# Patient Record
Sex: Male | Born: 1975 | Race: White | Hispanic: No | Marital: Married | Smoking: Former smoker
Health system: Southern US, Community
[De-identification: ages and names within clinical notes are randomized; demographics above are authoritative.]

## PROBLEM LIST (undated history)

## (undated) DIAGNOSIS — K649 Unspecified hemorrhoids: Secondary | ICD-10-CM

## (undated) DIAGNOSIS — Q984 Klinefelter syndrome, unspecified: Secondary | ICD-10-CM

## (undated) DIAGNOSIS — E291 Testicular hypofunction: Secondary | ICD-10-CM

## (undated) DIAGNOSIS — E785 Hyperlipidemia, unspecified: Secondary | ICD-10-CM

## (undated) DIAGNOSIS — K219 Gastro-esophageal reflux disease without esophagitis: Secondary | ICD-10-CM

## (undated) HISTORY — DX: Gastro-esophageal reflux disease without esophagitis: K21.9

## (undated) HISTORY — DX: Testicular hypofunction: E29.1

## (undated) HISTORY — DX: Unspecified hemorrhoids: K64.9

## (undated) HISTORY — PX: OTHER SURGICAL HISTORY: SHX169

## (undated) HISTORY — DX: Klinefelter syndrome, unspecified: Q98.4

## (undated) HISTORY — DX: Hyperlipidemia, unspecified: E78.5

## (undated) HISTORY — PX: NASAL SEPTUM SURGERY: SHX37

---

## 2013-04-29 ENCOUNTER — Emergency Department (HOSPITAL_COMMUNITY)
Admission: EM | Admit: 2013-04-29 | Discharge: 2013-04-29 | Disposition: A | Discharge status: Home / Self Care | Payer: PRIVATE HEALTH INSURANCE | Attending: Emergency Medicine | Admitting: Emergency Medicine

## 2013-04-29 ENCOUNTER — Emergency Department (HOSPITAL_COMMUNITY): Payer: PRIVATE HEALTH INSURANCE

## 2013-04-29 ENCOUNTER — Encounter (HOSPITAL_COMMUNITY): Payer: Self-pay | Admitting: *Deleted

## 2013-04-29 DIAGNOSIS — S20211A Contusion of right front wall of thorax, initial encounter: Secondary | ICD-10-CM

## 2013-04-29 DIAGNOSIS — S20219A Contusion of unspecified front wall of thorax, initial encounter: Secondary | ICD-10-CM | POA: Insufficient documentation

## 2013-04-29 DIAGNOSIS — W1809XA Striking against other object with subsequent fall, initial encounter: Secondary | ICD-10-CM | POA: Insufficient documentation

## 2013-04-29 DIAGNOSIS — Z87891 Personal history of nicotine dependence: Secondary | ICD-10-CM | POA: Insufficient documentation

## 2013-04-29 DIAGNOSIS — Y929 Unspecified place or not applicable: Secondary | ICD-10-CM | POA: Insufficient documentation

## 2013-04-29 DIAGNOSIS — Y9389 Activity, other specified: Secondary | ICD-10-CM | POA: Insufficient documentation

## 2013-04-29 MED ORDER — HYDROCODONE-ACETAMINOPHEN 5-325 MG PO TABS: 1.0000 | ORAL_TABLET | Freq: Four times a day (QID) | ORAL | Status: DC | PRN

## 2013-04-29 MED ORDER — IBUPROFEN 800 MG PO TABS: 800.0000 mg | ORAL_TABLET | Freq: Three times a day (TID) | ORAL | Status: DC

## 2013-04-29 NOTE — Progress Notes (Signed)
Met Patient at bedside.Role of case manager explained.Patient verbalizes his understanding.Patient Health insurance-PCP information not listed On EPIC facesheet.Patient information verified And given to patient check in.Patient reports rib pain as reason for ED visit.No case manager needs identified.

## 2013-04-29 NOTE — ED Provider Notes (Signed)
CSN: [id number]     Arrival date & time 04/29/13  1207 History   First MD Initiated Contact with Patient 04/29/13 1221     Chief Complaint  Patient presents with  . Fall   (Consider location/radiation/quality/duration/timing/severity/associated sxs/prior Treatment) HPI Patient is a 37 year old male who presented to emergency department complaint of a right rib pain. Patient states he had a heavy backpack on which was filled with breakfast and he was hiking for local challenge when he suddenly fell down and he said that backpack at that time was on his chest and it hit him in the right ribs. Patient states that this happened last night. Patient states that he was able to still walk around and he went to work today. Patient states the pain progressively got worse as the day went on. Patient states that today while having a bowel movement he had a brief episode of dizziness. Patient states that he did not have any shortness of breath or abdominal pain. No nausea or vomiting. He took a muscle relaxants for his pain which did not help. History reviewed. No pertinent past medical history. Past Surgical History  Procedure Laterality Date  . Testicular cyst removal     No family history on file. History  Substance Use Topics  . Smoking status: Former Games developer  . Smokeless tobacco: Not on file  . Alcohol Use: Yes     Comment: occ    Review of Systems  Constitutional: Negative for fever and chills.  HENT: Negative for neck pain and neck stiffness.   Respiratory: Negative for cough, chest tightness and shortness of breath.   Cardiovascular: Positive for chest pain. Negative for palpitations and leg swelling.  Gastrointestinal: Negative for nausea, vomiting, abdominal pain, diarrhea and abdominal distention.  Genitourinary: Negative for dysuria, hematuria and flank pain.  Musculoskeletal: Negative for arthralgias.  Skin: Negative for rash.  Allergic/Immunologic: Negative for immunocompromised  state.  Neurological: Negative for dizziness, weakness, light-headedness, numbness and headaches.    Allergies  Review of patient's allergies indicates no known allergies.  Home Medications   Current Outpatient Rx  Name  Route  Sig  Dispense  Refill  . beta carotene w/minerals (OCUVITE) tablet   Oral   Take 1 tablet by mouth daily.         . Calcium Carbonate-Vitamin D (CALCIUM + D PO)   Oral   Take 1 tablet by mouth daily.         . Cholecalciferol (VITAMIN D PO)   Oral   Take 1 tablet by mouth daily.         . Multiple Vitamin (MULTIVITAMIN WITH MINERALS) TABS tablet   Oral   Take 1 tablet by mouth daily.         Marland Kitchen HYDROcodone-acetaminophen (NORCO) 5-325 MG per tablet   Oral   Take 1 tablet by mouth every 6 (six) hours as needed for pain.   15 tablet   0   . ibuprofen (ADVIL,MOTRIN) 800 MG tablet   Oral   Take 1 tablet (800 mg total) by mouth 3 (three) times daily.   21 tablet   0    BP 116/68  Pulse 73  Temp(Src) 97.4 F (36.3 C) (Oral)  Resp 18  SpO2 98% Physical Exam  Nursing note and vitals reviewed. Constitutional: He appears well-developed and well-nourished. No distress.  HENT:  Head: Normocephalic and atraumatic.  Eyes: Conjunctivae are normal.  Neck: Neck supple.  Cardiovascular: Normal rate, regular rhythm and normal heart sounds.  Pulmonary/Chest: Effort normal. No respiratory distress. He has no wheezes. He has no rales.  No bruising or swelling noted over right rib cage. No crepitus. Normal chest wall movement. Tenderness to palpation over ribs 9 and 10. No deformity  Abdominal: Soft. Bowel sounds are normal. There is tenderness.  No bruising  Musculoskeletal: He exhibits no edema.  Neurological: He is alert.  Skin: Skin is warm and dry.    ED Course  Procedures (including critical care time) Labs Review Labs Reviewed - No data to display Imaging Review Dg Ribs Unilateral W/chest Right  04/29/2013   CLINICAL DATA:  Fall.   EXAM: RIGHT RIBS AND CHEST - 3+ VIEW  COMPARISON:  None.  FINDINGS: No fracture or other bone lesions are seen involving the ribs. There is no evidence of pneumothorax or pleural effusion. Both lungs are clear. Heart size and mediastinal contours are within normal limits.  IMPRESSION: 1. No displaced rib fractures identified.   Electronically Signed   By: Signa Kell M.D.   On: 04/29/2013 12:57    MDM   1. Contusion of rib, right, initial encounter    Patient with right rib pain after getting hit with a backpack that was full breakfast. Patient denies any shortness of breath. Patient does not have any abdominal tenderness on exam. X-rays of the ribs and chest obtained and are negative. Will treat patient with pain medications at and ibuprofen at home. Follow up as needed. Return precautions given  Filed Vitals:   04/29/13 1214  BP: 116/68  Pulse: 73  Temp: 97.4 F (36.3 C)  TempSrc: Oral  Resp: 18  SpO2: 98%       Bianco Cange A Tzipora Mcinroy, PA-C 04/29/13 1336

## 2013-04-29 NOTE — ED Notes (Signed)
Pt had 30lb back pack on yesterday and tripped and fell, pt states when he got up right rib cage felt really sore.  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  Pt had BM and felt faint, then got nauseated.  Pt has pain with breathing.

## 2013-04-29 NOTE — ED Notes (Signed)
Tatyana PA at bedside   

## 2013-04-29 NOTE — ED Notes (Signed)
Discharge instructions reviewed with pt. Pt verbalized understanding.   

## 2013-04-29 NOTE — ED Notes (Signed)
Pt states pain comes when he breathes. Pain is located in right upper quadrant. Pt rates pain 6/10. Pt transported to x-ray.

## 2013-04-30 NOTE — ED Provider Notes (Signed)
Medical screening examination/treatment/procedure(s) were performed by non-physician practitioner and as supervising physician I was immediately available for consultation/collaboration.  Derwood Kaplan, MD 04/30/13 1658

## 2013-06-18 ENCOUNTER — Emergency Department (HOSPITAL_COMMUNITY)
Admission: EM | Admit: 2013-06-18 | Discharge: 2013-06-18 | Disposition: A | Discharge status: Home / Self Care | Payer: PRIVATE HEALTH INSURANCE | Attending: Emergency Medicine | Admitting: Emergency Medicine

## 2013-06-18 ENCOUNTER — Emergency Department (HOSPITAL_COMMUNITY): Payer: PRIVATE HEALTH INSURANCE

## 2013-06-18 ENCOUNTER — Encounter (HOSPITAL_COMMUNITY): Payer: Self-pay | Admitting: Emergency Medicine

## 2013-06-18 DIAGNOSIS — R61 Generalized hyperhidrosis: Secondary | ICD-10-CM | POA: Insufficient documentation

## 2013-06-18 DIAGNOSIS — Z87891 Personal history of nicotine dependence: Secondary | ICD-10-CM | POA: Insufficient documentation

## 2013-06-18 DIAGNOSIS — R109 Unspecified abdominal pain: Secondary | ICD-10-CM | POA: Insufficient documentation

## 2013-06-18 LAB — URINALYSIS, ROUTINE W REFLEX MICROSCOPIC
Ketones, ur: NEGATIVE mg/dL
Nitrite: NEGATIVE
Specific Gravity, Urine: 1.029 (ref 1.005–1.030)
Urobilinogen, UA: 0.2 mg/dL (ref 0.0–1.0)

## 2013-06-18 LAB — CBC WITH DIFFERENTIAL/PLATELET
Basophils Absolute: 0.1 10*3/uL (ref 0.0–0.1)
Basophils Relative: 1 % (ref 0–1)
Eosinophils Absolute: 0.2 10*3/uL (ref 0.0–0.7)
Eosinophils Relative: 3 % (ref 0–5)
Lymphs Abs: 3.2 10*3/uL (ref 0.7–4.0)
MCH: 30 pg (ref 26.0–34.0)
MCHC: 33.7 g/dL (ref 30.0–36.0)
MCV: 88.9 fL (ref 78.0–100.0)
Monocytes Relative: 8 % (ref 3–12)
Neutro Abs: 3.1 10*3/uL (ref 1.7–7.7)
Platelets: 296 10*3/uL (ref 150–400)
RBC: 4.77 MIL/uL (ref 4.22–5.81)

## 2013-06-18 LAB — CG4 I-STAT (LACTIC ACID): Lactic Acid, Venous: 1.59 mmol/L (ref 0.5–2.2)

## 2013-06-18 LAB — COMPREHENSIVE METABOLIC PANEL
AST: 84 U/L — ABNORMAL HIGH (ref 0–37)
BUN: 16 mg/dL (ref 6–23)
Calcium: 9.6 mg/dL (ref 8.4–10.5)
GFR calc Af Amer: >90 mL/min (ref >90)
Glucose, Bld: 123 mg/dL — ABNORMAL HIGH (ref 70–99)
Potassium: 4.2 mEq/L (ref 3.5–5.1)
Sodium: 142 mEq/L (ref 135–145)
Total Protein: 7.6 g/dL (ref 6.0–8.3)

## 2013-06-18 LAB — TROPONIN I: Troponin I: <0.3 ng/mL (ref <0.30)

## 2013-06-18 LAB — LIPASE, BLOOD: Lipase: 56 U/L (ref 11–59)

## 2013-06-18 MED ORDER — HYDROMORPHONE HCL PF 1 MG/ML IJ SOLN
1.0000 mg | Freq: Once | INTRAMUSCULAR | Status: AC
  Administered 2013-06-18: 1 mg via INTRAVENOUS
  Filled 2013-06-18: qty 1

## 2013-06-18 MED ORDER — MAGNESIUM CITRATE PO SOLN: 1.0000 | Freq: Once | ORAL | Status: DC

## 2013-06-18 MED ORDER — IOHEXOL 300 MG/ML  SOLN
25.0000 mL | INTRAMUSCULAR | Status: DC | PRN
  Administered 2013-06-18: 25 mL via ORAL

## 2013-06-18 MED ORDER — HYDROCODONE-ACETAMINOPHEN 5-325 MG PO TABS: 2.0000 | ORAL_TABLET | ORAL | Status: DC | PRN

## 2013-06-18 MED ORDER — IOHEXOL 300 MG/ML  SOLN
100.0000 mL | Freq: Once | INTRAMUSCULAR | Status: AC | PRN
  Administered 2013-06-18: 100 mL via INTRAVENOUS

## 2013-06-18 MED ORDER — DICYCLOMINE HCL 20 MG PO TABS: 20.0000 mg | ORAL_TABLET | Freq: Two times a day (BID) | ORAL | Status: DC

## 2013-06-18 MED ORDER — ONDANSETRON HCL 4 MG/2ML IJ SOLN
4.0000 mg | Freq: Once | INTRAMUSCULAR | Status: AC
  Administered 2013-06-18: 4 mg via INTRAVENOUS
  Filled 2013-06-18: qty 2

## 2013-06-18 NOTE — ED Provider Notes (Signed)
CSN: [id number]     Arrival date & time 06/18/13  4098 History   First MD Initiated Contact with Patient 06/18/13 [phone]     No chief complaint on file.  (Consider location/radiation/quality/duration/timing/severity/associated sxs/prior Treatment) HPI Comments: Patient brought to the ER for evaluation of severe abdominal pain. Symptoms began around 6:30. Patient had sudden onset of severe right upper quadrant and mid abdominal pain. Patient reports pain is constant. He has been experiencing diaphoresis, no vomiting or diarrhea. Pain radiates up into the central chest region.  Patient is a 37 y.o. male presenting with abdominal pain.  Abdominal Pain   No past medical history on file. Past Surgical History  Procedure Laterality Date  . Testicular cyst removal     No family history on file. History  Substance Use Topics  . Smoking status: Former Games developer  . Smokeless tobacco: Not on file  . Alcohol Use: Yes     Comment: occ    Review of Systems  Constitutional: Positive for diaphoresis.  Gastrointestinal: Positive for abdominal pain.  All other systems reviewed and are negative.    Allergies  Review of patient's allergies indicates no known allergies.  Home Medications   Current Outpatient Rx  Name  Route  Sig  Dispense  Refill  . beta carotene w/minerals (OCUVITE) tablet   Oral   Take 1 tablet by mouth daily.         . Calcium Carbonate-Vitamin D (CALCIUM + D PO)   Oral   Take 1 tablet by mouth daily.         . Cholecalciferol (VITAMIN D PO)   Oral   Take 1 tablet by mouth daily.         Marland Kitchen HYDROcodone-acetaminophen (NORCO) 5-325 MG per tablet   Oral   Take 1 tablet by mouth every 6 (six) hours as needed for pain.   15 tablet   0   . ibuprofen (ADVIL,MOTRIN) 800 MG tablet   Oral   Take 1 tablet (800 mg total) by mouth 3 (three) times daily.   21 tablet   0   . Multiple Vitamin (MULTIVITAMIN WITH MINERALS) TABS tablet   Oral   Take 1 tablet by mouth  daily.          BP 126/62  Pulse 82  Resp 18  SpO2 100% Physical Exam  Constitutional: He is oriented to person, place, and time. He appears well-developed and well-nourished. He appears distressed.  HENT:  Head: Normocephalic and atraumatic.  Right Ear: Hearing normal.  Left Ear: Hearing normal.  Nose: Nose normal.  Mouth/Throat: Oropharynx is clear and moist and mucous membranes are normal.  Eyes: Conjunctivae and EOM are normal. Pupils are equal, round, and reactive to light.  Neck: Normal range of motion. Neck supple.  Cardiovascular: Regular rhythm, S1 normal and S2 normal.  Exam reveals no gallop and no friction rub.   No murmur heard. Pulmonary/Chest: Effort normal and breath sounds normal. No respiratory distress. He exhibits no tenderness.  Abdominal: Soft. Normal appearance and bowel sounds are normal. There is no hepatosplenomegaly. There is tenderness in the right upper quadrant and epigastric area. There is no rebound, no guarding, no tenderness at McBurney's point and negative Murphy's sign. No hernia.  Musculoskeletal: Normal range of motion.  Neurological: He is alert and oriented to person, place, and time. He has normal strength. No cranial nerve deficit or sensory deficit. Coordination normal. GCS eye subscore is 4. GCS verbal subscore is 5. GCS motor subscore  is 6.  Skin: Skin is warm, dry and intact. No rash noted. No cyanosis.  Psychiatric: He has a normal mood and affect. His speech is normal and behavior is normal. Thought content normal.    ED Course  Procedures (including critical care time) Labs Review Labs Reviewed - No data to display Imaging Review No results found.  EKG Interpretation   None       MDM  Diagnosis: Abdominal pain  She presents with complaints of severe right upper quadrant abdominal pains, sudden onset. Exam revealed tenderness without guarding or rebound. Patient appear to be in significant distress arrival. He improved with  analgesia. Blood work revealed slightly elevated AST and ALT, otherwise no abnormalities. Ultrasound performed and no evidence of cholelithiasis or cholecystitis seen. CAT scan therefore performed to rule out diverticulitis, appendicitis, SBO. CAT scan showed no acute pathology. Patient will be discharged to follow up with GI for further evaluation. Symptomatic treatment at discharge. He does have some evidence of constipation, possibly because of the pain. Treat with mag citrate in addition to antispasmodic and analgesia.    Gilda Crease, MD 06/21/13 [phone]

## 2013-06-18 NOTE — ED Notes (Signed)
Family at bedside. 

## 2013-06-18 NOTE — ED Notes (Signed)
Pt reports abdominal pain. Pt diaphoretic and tearful.

## 2013-06-18 NOTE — ED Notes (Signed)
PT comfortable with d/c and f/u instructions. Prescriptions x3 

## 2013-06-20 ENCOUNTER — Other Ambulatory Visit: Payer: PRIVATE HEALTH INSURANCE

## 2013-06-20 ENCOUNTER — Telehealth: Payer: Self-pay | Admitting: Gastroenterology

## 2013-06-20 ENCOUNTER — Ambulatory Visit (INDEPENDENT_AMBULATORY_CARE_PROVIDER_SITE_OTHER): Payer: PRIVATE HEALTH INSURANCE | Admitting: Gastroenterology

## 2013-06-20 ENCOUNTER — Encounter: Payer: Self-pay | Admitting: Gastroenterology

## 2013-06-20 VITALS — BP 120/70 | HR 66 | Ht 70.0 in | Wt 182.4 lb

## 2013-06-20 DIAGNOSIS — R1011 Right upper quadrant pain: Secondary | ICD-10-CM | POA: Insufficient documentation

## 2013-06-20 DIAGNOSIS — R109 Unspecified abdominal pain: Secondary | ICD-10-CM

## 2013-06-20 LAB — HEPATIC FUNCTION PANEL
ALT: 59 U/L — ABNORMAL HIGH (ref 0–53)
Albumin: 4.6 g/dL (ref 3.5–5.2)
Alkaline Phosphatase: 64 U/L (ref 39–117)
Total Protein: 7.9 g/dL (ref 6.0–8.3)

## 2013-06-20 NOTE — Telephone Encounter (Signed)
Spoke with pts wife and they called back and wanted to let Dr. Arlyce Dice know that the pts father had to have his gallbladder removed about 5-6 years ago. Dr. Arlyce Dice notified.

## 2013-06-20 NOTE — Telephone Encounter (Signed)
Spoke with pt and he is aware. 

## 2013-06-20 NOTE — Progress Notes (Signed)
History of Present Illness:  37 year old white male referred for evaluation of abdominal pain.  2 nights ago he was seen in the ER for sudden onset severe right upper quadrant pain.  Pain was incapacitating.  It was without radiation or accompanied by nausea or vomiting.  Ultrasound and CT, which I reviewed, were unremarkable.  Lab work was pertinent for normal white count, AST 84 ALT 66.  Urinalysis was normal.  Pain slowly subsided and has not recurred.  About a week ago he had a low-grade fever.  He has no antecedent GI complaints.  He is on testosterone.  There is no history of drug use or transfusions or hepatitis.    History reviewed. No pertinent past medical history. Past Surgical History  Procedure Laterality Date  . Testicular cyst removal     family history includes Prostate cancer in his father. There is no history of Colon cancer. Current Outpatient Prescriptions  Medication Sig Dispense Refill  . beta carotene w/minerals (OCUVITE) tablet Take 1 tablet by mouth daily.      . Calcium Carbonate-Vitamin D (CALCIUM + D PO) Take 1 tablet by mouth daily.      . Cholecalciferol (VITAMIN D PO) Take 1 tablet by mouth daily.      Marland Kitchen ibuprofen (ADVIL,MOTRIN) 200 MG tablet Take 600 mg by mouth every 6 (six) hours as needed for fever.      . Multiple Vitamin (MULTIVITAMIN WITH MINERALS) TABS tablet Take 1 tablet by mouth daily.      . Testosterone (ANDROGEL PUMP) 20.25 MG/ACT (1.62%) GEL Place 3 Act onto the skin daily.       No current facility-administered medications for this visit.   Allergies as of 06/20/2013  . (No Known Allergies)    reports that he has quit smoking. His smoking use included Cigarettes. He smoked 0.00 packs per day for 10 years. He has quit using smokeless tobacco. His smokeless tobacco use included Snuff. He reports that he drinks alcohol. He reports that he does not use illicit drugs.     Review of Systems: Approximately month ago he fell on his right side Clydie Braun  a backpack and injured his ribs.  Rib films were negative.  His comfort quickly resolved.  Pertinent positive and negative review of systems were noted in the above HPI section. All other review of systems were otherwise negative.  Vital signs were reviewed in today's medical record Physical Exam: General: Well developed , well nourished, no acute distress Skin: anicteric Head: Normocephalic and atraumatic Eyes:  sclerae anicteric, EOMI Ears: Normal auditory acuity Mouth: No deformity or lesions Neck: Supple, no masses or thyromegaly Lungs: Clear throughout to auscultation Heart: Regular rate and rhythm; no murmurs, rubs or bruits Abdomen: Soft,  and non distended. No masses, hepatosplenomegaly or hernias noted. Normal Bowel sounds.  There is minimal tenderness in the midepigastrium to deep palpation Rectal:deferred Musculoskeletal: Symmetrical with no gross deformities  Skin: No lesions on visible extremities Pulses:  Normal pulses noted Extremities: No clubbing, cyanosis, edema or deformities noted Neurological: Alert oriented x 4, grossly nonfocal Cervical Nodes:  No significant cervical adenopathy Inguinal Nodes: No significant inguinal adenopathy Psychological:  Alert and cooperative. Normal mood and affect

## 2013-06-20 NOTE — Telephone Encounter (Signed)
One liver enzyme has normalized, the other has decreased but is still minimally elevated.  Await HIDA scan

## 2013-06-20 NOTE — Assessment & Plan Note (Signed)
Sudden onset of incapacitating right upper quadrant pain is suggestive of biliary colic.  CT and ultrasound were negative.  Note slightly elevated transaminases.  I remain suspicious that he has biliary colic.  He is taking a testosterone which potentially could cause some mild LFT abnormalities.  Recommendations #1 HIDA scan #2 repeat LFTs; if still abnormal I will obtain hepatitis serologies and markers for chronic liver disease

## 2013-06-20 NOTE — Patient Instructions (Signed)
You have been scheduled for a HIDA scan at [facility] (1st floor) on 11/26 at 7:30am.. Please arrive 15 minutes prior to your scheduled appointment. Make certain not to have anything to eat or drink at least 6 hours prior to your test. Should this appointment date or time not work well for you, please call radiology scheduling at [phone].  _____________________________________________________________________ hepatobiliary (HIDA) scan is an imaging procedure used to diagnose problems in the liver, gallbladder and bile ducts. In the HIDA scan, a radioactive chemical or tracer is injected into a vein in your arm. The tracer is handled by the liver like bile. Bile is a fluid produced and excreted by your liver that helps your digestive system break down fats in the foods you eat. Bile is stored in your gallbladder and the gallbladder releases the bile when you eat a meal. A special nuclear medicine scanner (gamma camera) tracks the flow of the tracer from your liver into your gallbladder and small intestine.  During your HIDA scan  You'll be asked to change into a hospital gown before your HIDA scan begins. Your health care team will position you on a table, usually on your back. The radioactive tracer is then injected into a vein in your arm.The tracer travels through your bloodstream to your liver, where it's taken up by the bile-producing cells. The radioactive tracer travels with the bile from your liver into your gallbladder and through your bile ducts to your small intestine.You may feel some pressure while the radioactive tracer is injected into your vein. As you lie on the table, a special gamma camera is positioned over your abdomen taking pictures of the tracer as it moves through your body. The gamma camera takes pictures continually for about an hour. You'll need to keep still during the HIDA scan. This can become uncomfortable, but you may find that you can lessen the discomfort by taking deep breaths and  thinking about other things. Tell your health care team if you're uncomfortable. The radiologist will watch on a computer the progress of the radioactive tracer through your body. The HIDA scan may be stopped when the radioactive tracer is seen in the gallbladder and enters your small intestine. This typically takes about an hour. In some cases extra imaging will be performed if original images aren't satisfactory, if morphine is given to help visualize the gallbladder or if the medication CCK is given to look at the contraction of the gallbladder. This test typically takes 2 hours to complete. ________________________________________________________________________   Go to the basement for labs today

## 2013-06-20 NOTE — Telephone Encounter (Signed)
Left message for pt to call back  °

## 2013-06-20 NOTE — Telephone Encounter (Signed)
Pt calling for his lab results, please advise.

## 2013-07-05 ENCOUNTER — Encounter (HOSPITAL_COMMUNITY)
Admission: RE | Admit: 2013-07-05 | Discharge: 2013-07-05 | Disposition: A | Discharge status: Home / Self Care | Payer: PRIVATE HEALTH INSURANCE | Source: Ambulatory Visit | Attending: Gastroenterology | Admitting: Gastroenterology

## 2013-07-05 DIAGNOSIS — R109 Unspecified abdominal pain: Secondary | ICD-10-CM | POA: Insufficient documentation

## 2013-07-05 MED ORDER — TECHNETIUM TC 99M MEBROFENIN IV KIT
5.1000 | PACK | Freq: Once | INTRAVENOUS | Status: AC | PRN
  Administered 2013-07-05: 5 via INTRAVENOUS

## 2013-07-10 ENCOUNTER — Other Ambulatory Visit: Payer: Self-pay

## 2013-07-10 ENCOUNTER — Telehealth: Payer: Self-pay

## 2013-07-10 DIAGNOSIS — R109 Unspecified abdominal pain: Secondary | ICD-10-CM

## 2013-07-10 NOTE — Telephone Encounter (Signed)
Notes Recorded by Louis Meckel, MD on 07/10/2013 at 10:52 AM HIDA scan is normal. Patient needs blood work including iron, TIBC, ferritin, ANA, AMA, alpha-1 antitrypsin and ceruloplasmin levels, hepatitis A, B, and C serologies   Pt aware and will come for the labs.

## 2013-07-20 ENCOUNTER — Other Ambulatory Visit (INDEPENDENT_AMBULATORY_CARE_PROVIDER_SITE_OTHER): Payer: PRIVATE HEALTH INSURANCE

## 2013-07-20 DIAGNOSIS — R109 Unspecified abdominal pain: Secondary | ICD-10-CM

## 2013-07-20 LAB — IRON AND TIBC
Iron: 65 ug/dL (ref 42–165)
UIBC: 322 ug/dL (ref 125–400)

## 2013-07-20 LAB — IRON: Iron: 62 ug/dL (ref 42–165)

## 2013-07-20 LAB — FERRITIN: Ferritin: 124.7 ng/mL (ref 22.0–322.0)

## 2013-07-21 ENCOUNTER — Ambulatory Visit (INDEPENDENT_AMBULATORY_CARE_PROVIDER_SITE_OTHER): Payer: PRIVATE HEALTH INSURANCE | Admitting: Gastroenterology

## 2013-07-21 ENCOUNTER — Encounter: Payer: Self-pay | Admitting: Gastroenterology

## 2013-07-21 VITALS — BP 100/58 | HR 80 | Ht 70.0 in | Wt 187.5 lb

## 2013-07-21 DIAGNOSIS — R1011 Right upper quadrant pain: Secondary | ICD-10-CM

## 2013-07-21 LAB — HEPATITIS C ANTIBODY: HCV Ab: NEGATIVE

## 2013-07-21 LAB — HEPATITIS B SURFACE ANTIBODY, QUANTITATIVE: Hepatitis B-Post: 0.4 m[IU]/mL

## 2013-07-21 LAB — HEPATITIS A ANTIBODY, TOTAL: Hep A Total Ab: NONREACTIVE

## 2013-07-21 LAB — ANA: Anti Nuclear Antibody(ANA): NEGATIVE

## 2013-07-21 NOTE — Assessment & Plan Note (Signed)
Etiology of pain is uncertain.  Workup for biliary tract disease including CT, ultrasound and HIDA scan was negative.  There was slight improvement in his LFTs still raising the question of biliary colic.  Recommendations #1 expectant management.  The patient was carefully instructed to contact the office or go to the ED should he develop recurrent abdominal pain.  Should this occur and LFTs become elevated, I think this would be good evidence for biliary tract disease.

## 2013-07-21 NOTE — Patient Instructions (Signed)
Follow up as needed

## 2013-07-21 NOTE — Progress Notes (Signed)
          History of Present Illness:  William Horton has returned for followup of abdominal pain.  He's had no recurrences.  Blood work up was entirely unrevealing.  HIDA scan was negative except for ALT 59.  He was previously 66.  AST normalized from 84.    Review of Systems: Pertinent positive and negative review of systems were noted in the above HPI section. All other review of systems were otherwise negative.    Current Medications, Allergies, Past Medical History, Past Surgical History, Family History and Social History were reviewed in [employer] electronic medical record  Vital signs were reviewed in today's medical record. Physical Exam: General: Well developed , well nourished, no acute distress

## 2013-07-22 LAB — ALPHA-1-ANTITRYPSIN: A-1 Antitrypsin, Ser: 135 mg/dL (ref 90–200)

## 2013-07-22 LAB — CERULOPLASMIN: Ceruloplasmin: 23 mg/dL (ref 20–60)

## 2015-07-24 IMAGING — CR DG CHEST 2V
2 series · 2 of 2 positions shown · non-contrast
Comparison: [DATE]

CLINICAL DATA: Chest pain, right abdominal pain and right-sided
back pain. Fever last week.

EXAM:
CHEST  2 VIEW

[w chest pa]
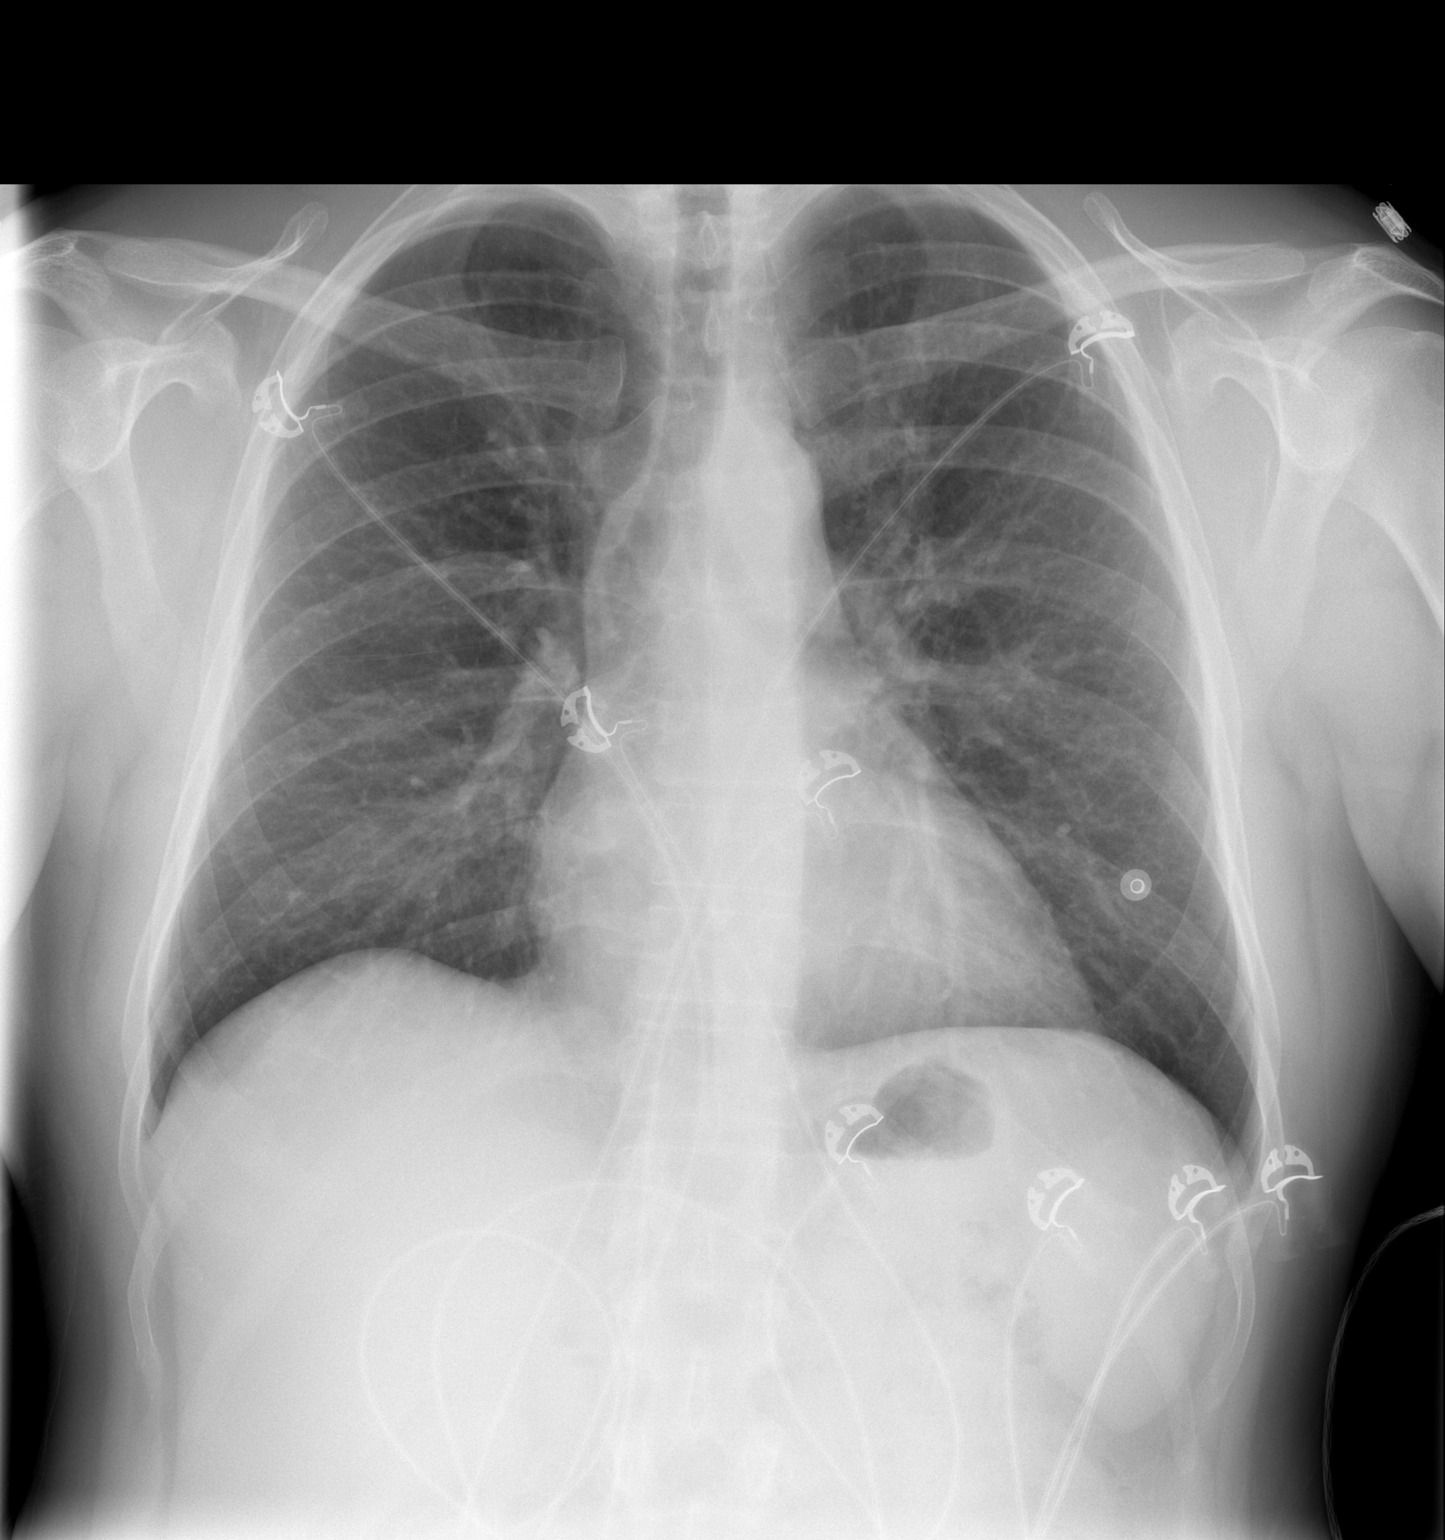

[w chest lat]
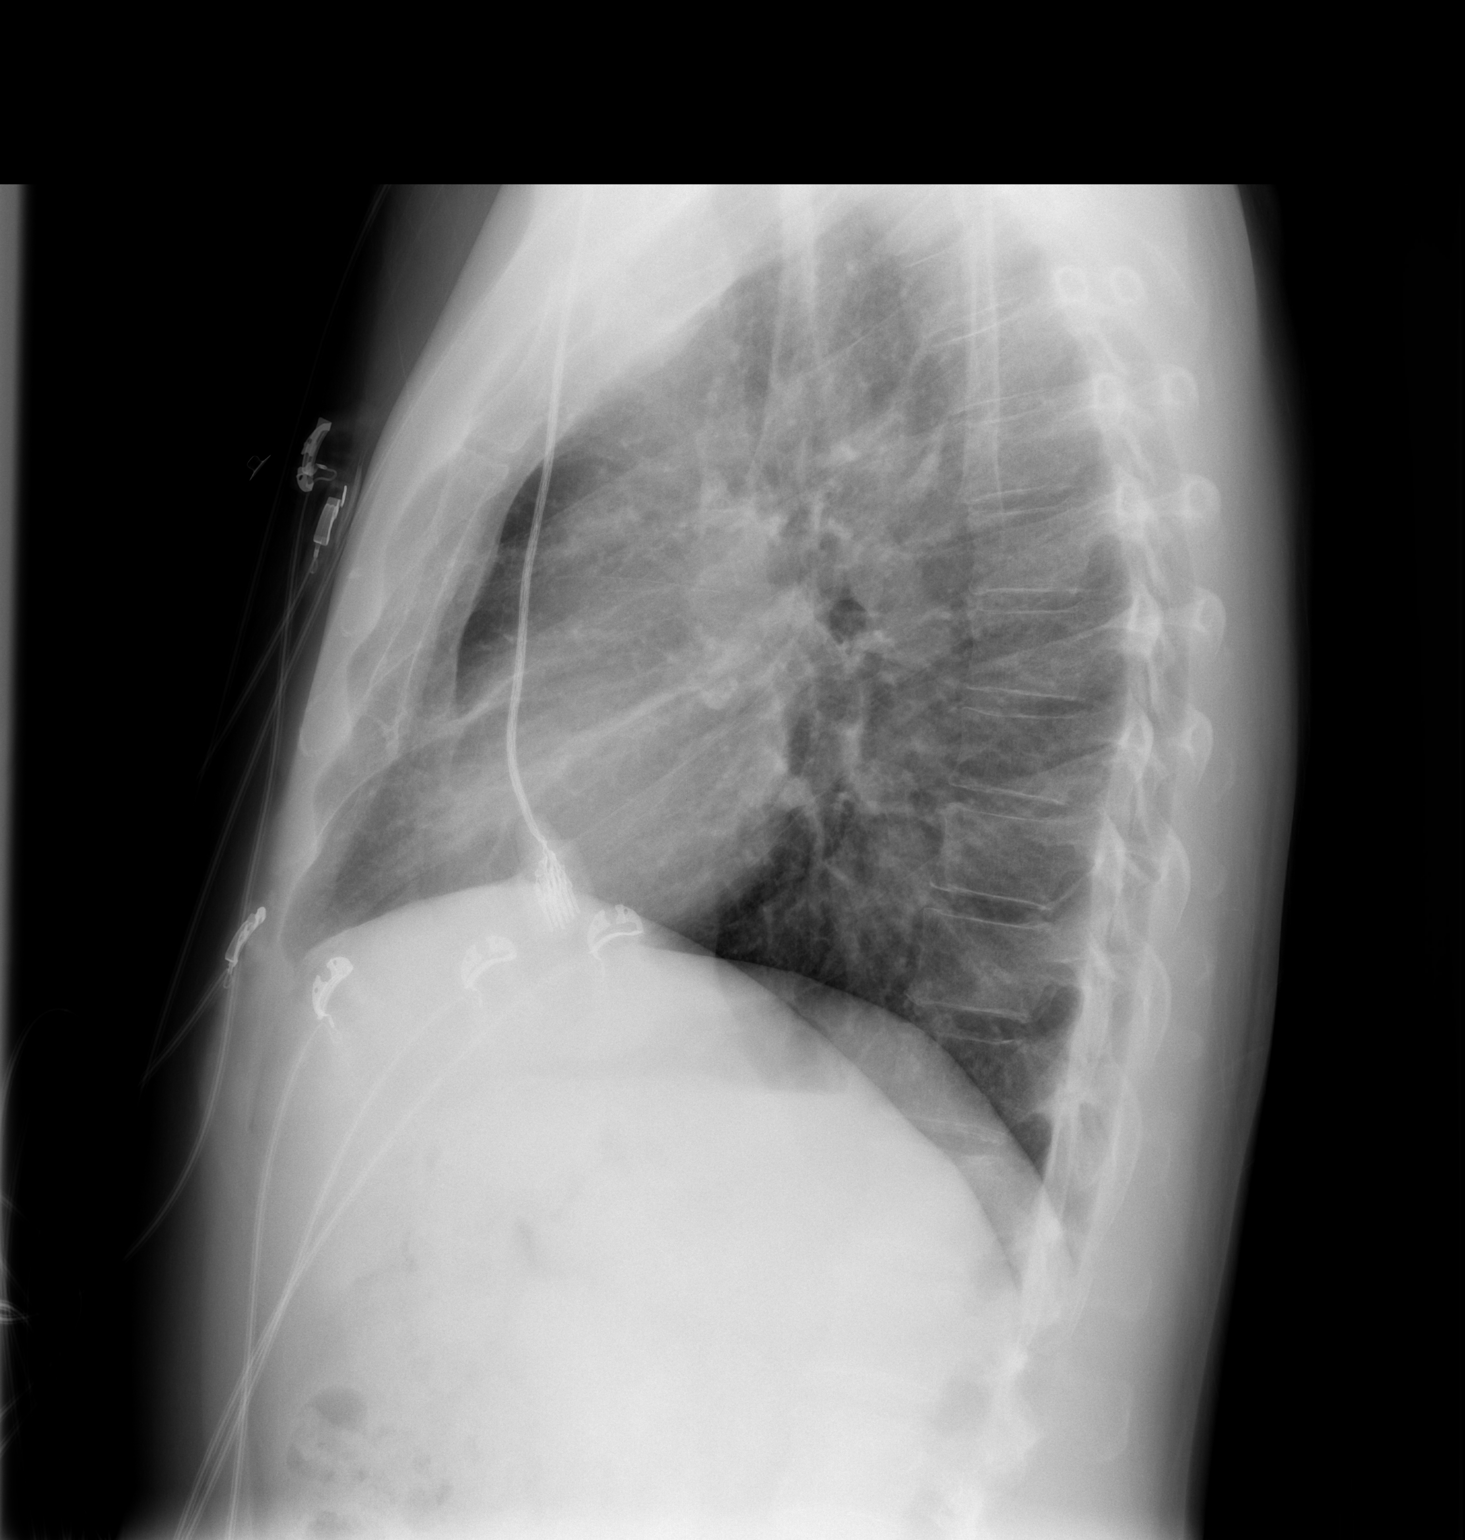

[2 of 2 positions shown; findings below may reference images not displayed]

FINDINGS: Lungs are clear. The cardiomediastinal silhouette and remainder of
the exam is unchanged.
IMPRESSION: No acute cardiopulmonary disease.

## 2015-07-24 IMAGING — CT CT ABD-PELV W/ CM
2 of 4 series · 16 of 46 positions shown, 18 images · IV contrast (APPLIED)
Comparison: None.

CLINICAL DATA: Right abdominal pain.

EXAM:
CT ABDOMEN AND PELVIS WITH CONTRAST
TECHNIQUE: Multidetector CT imaging of the abdomen and pelvis was performed
using the standard protocol following bolus administration of
intravenous contrast.
CONTRAST:  100mL OMNIPAQUE IOHEXOL 300 MG/ML  SOLN

[Series 2: abd/ pelvis 5.0 i30f 1 · axial · 0.61mm/px · z∈[-1119,-709]mm · 13 of 92 slices shown, 15 images]
[im 5/92  soft-tissue]
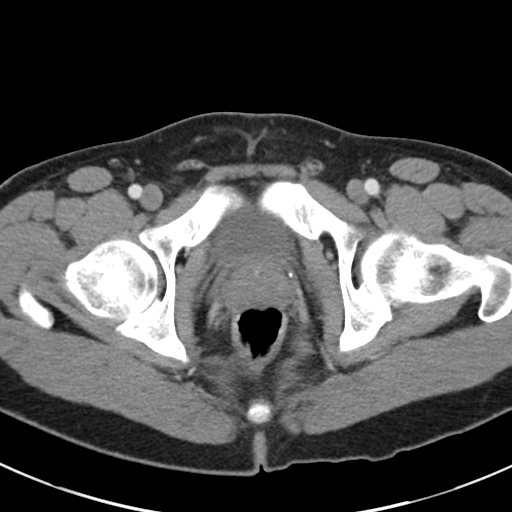
[im 5/92  bone]
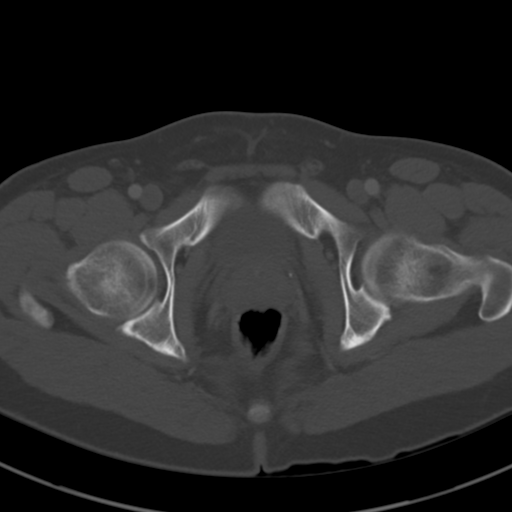
[im 13/92  soft-tissue]
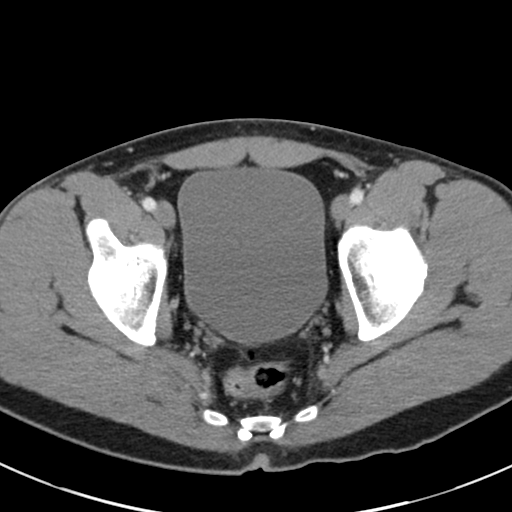
[im 21/92  soft-tissue]
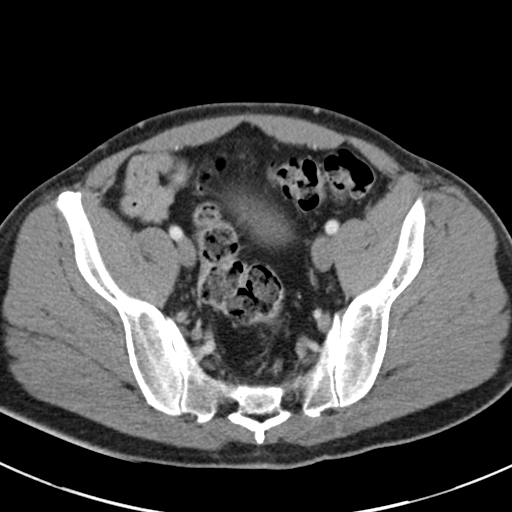
[im 25/92  soft-tissue]
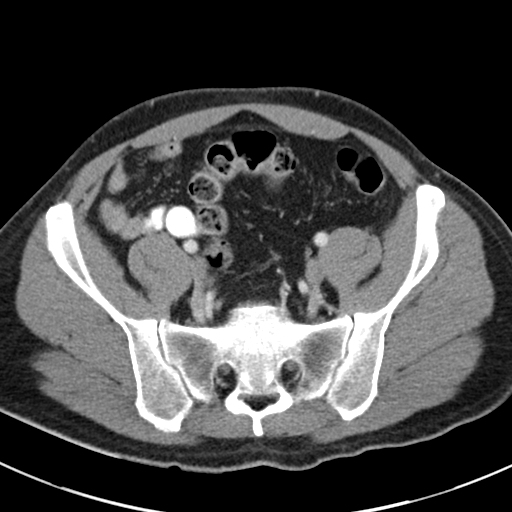
[im 34/92  soft-tissue]
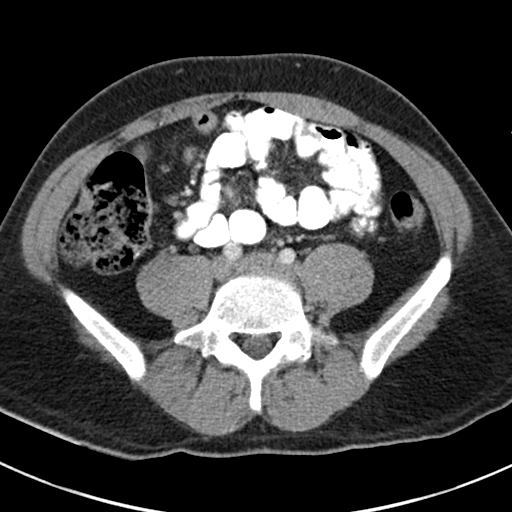
[im 38/92  soft-tissue]
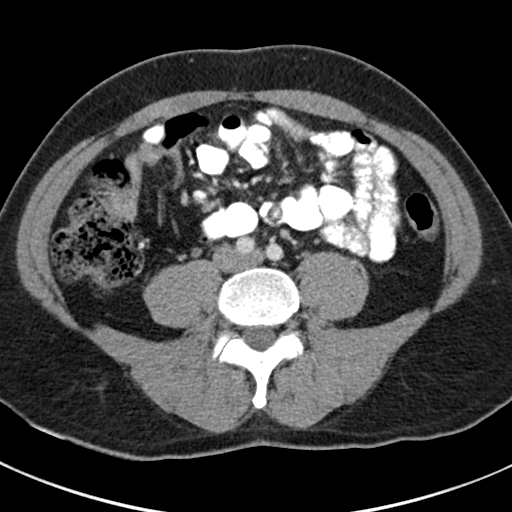
[im 46/92  soft-tissue]
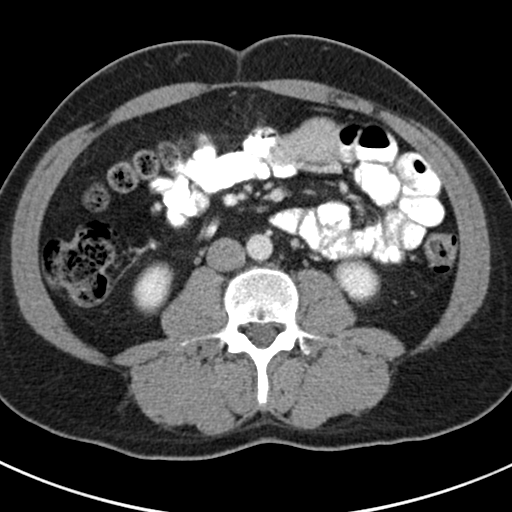
[im 54/92  soft-tissue]
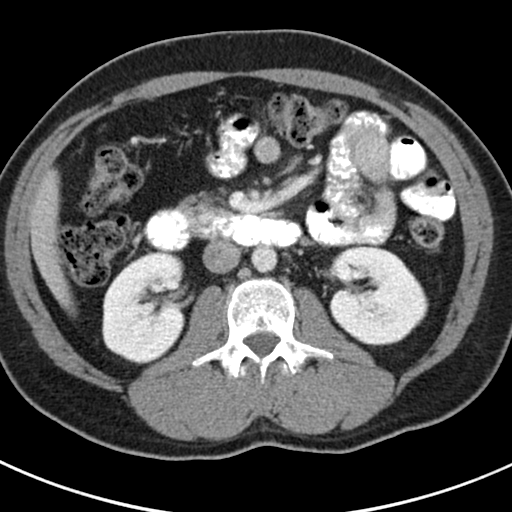
[im 58/92  soft-tissue]
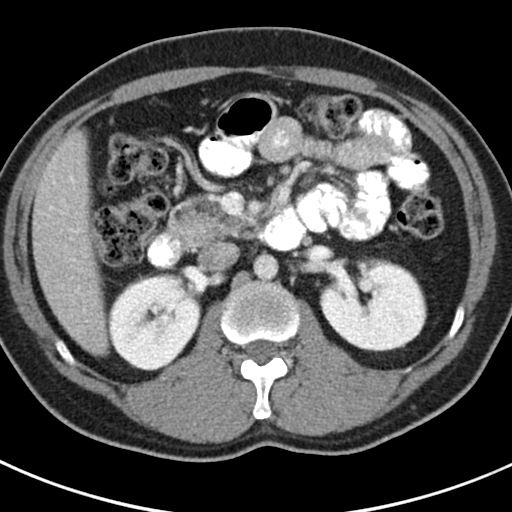
[im 58/92  bone]
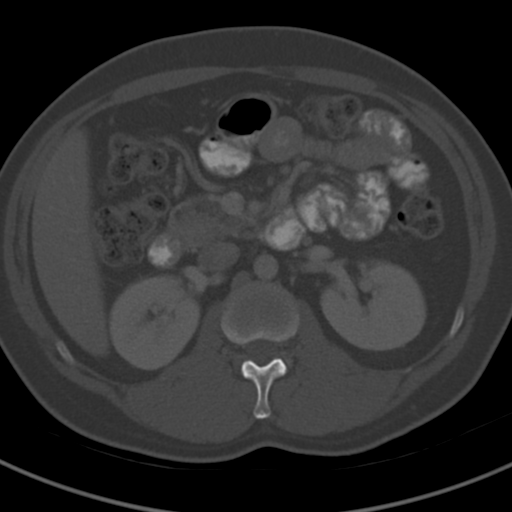
[im 67/92  soft-tissue]
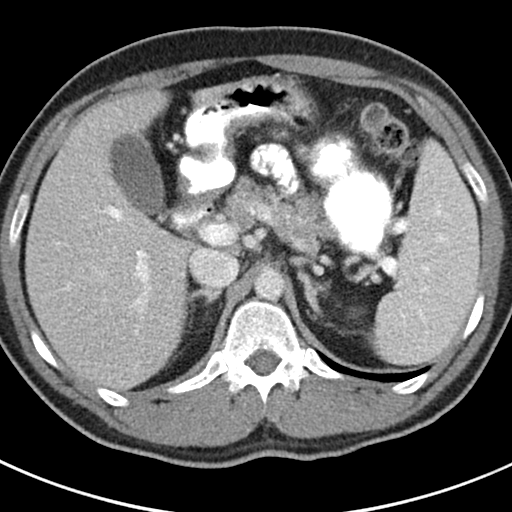
[im 71/92  soft-tissue]
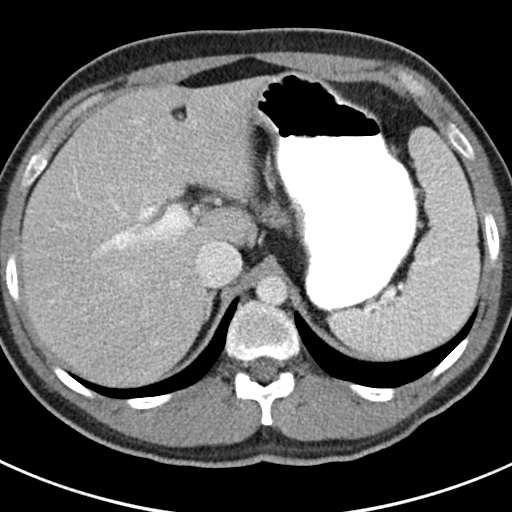
[im 79/92  soft-tissue]
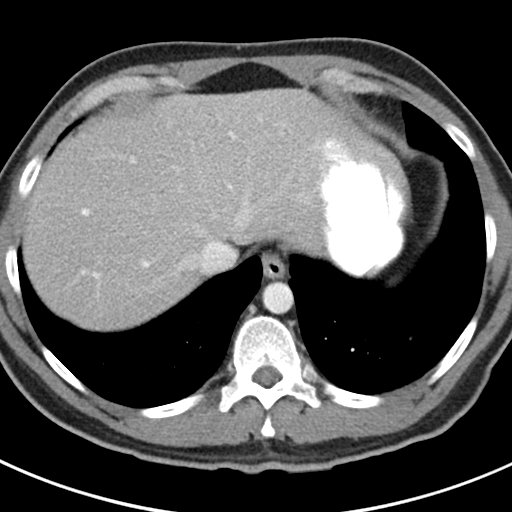
[im 87/92  soft-tissue]
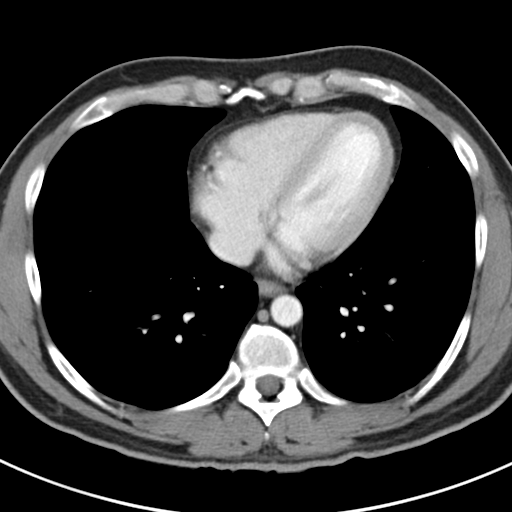

[Series 5: cor · coronal · 0.64mm/px · 3 of 123 slices shown]
[im 41/123  soft-tissue]
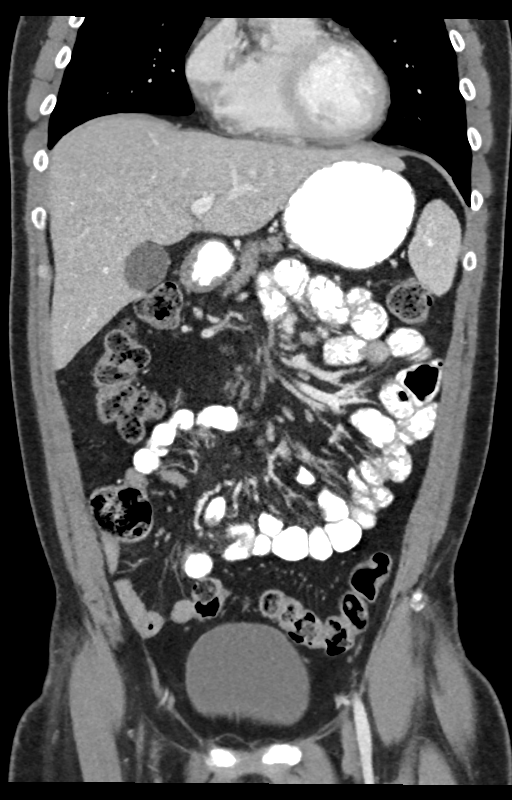
[im 55/123  soft-tissue]
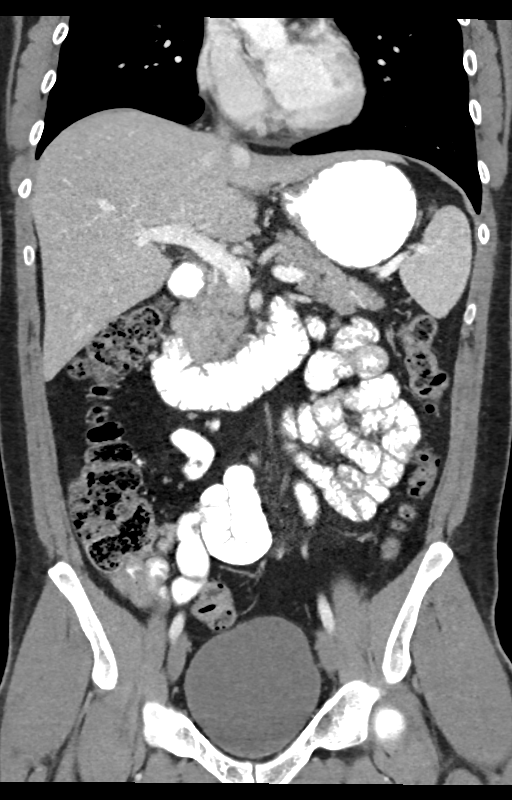
[im 68/123  soft-tissue]
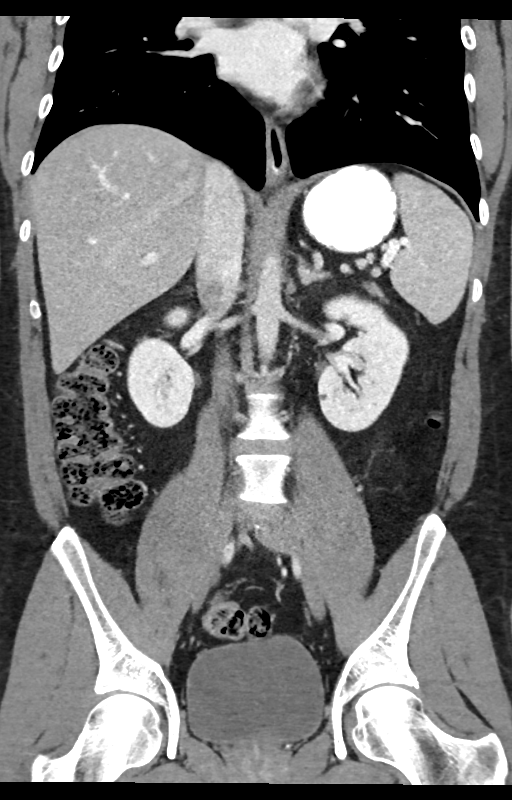

[16 of 46 positions shown; findings below may reference images not displayed]

FINDINGS: Sagittal images of the spine are unremarkable. Lung bases are
unremarkable. Enhanced liver, spleen, pancreas and adrenals are
unremarkable. No calcified gallstones are noted within gallbladder.

Enhanced kidneys are symmetrical in size. No hydronephrosis or
hydroureters. Moderate colonic stool. Abundant colonic stool in the
cecum. No pericecal inflammation. Normal appendix clearly visualize
in axial image 61.

No aortic aneurysm. No small bowel obstruction. No ascites or free
air. No adenopathy. Mild distended urinary bladder. Prostate gland
and seminal vesicles are unremarkable. No destructive bony lesions
are noted within pelvis.
IMPRESSION: 1. No acute inflammatory process within abdomen or pelvis.
2. No hydronephrosis or hydroureter.
3. Abundant stool in right colon and cecum. No pericecal
inflammation. Normal appendix.
4. No small bowel obstruction.

## 2017-05-10 ENCOUNTER — Other Ambulatory Visit: Payer: PRIVATE HEALTH INSURANCE

## 2017-05-10 ENCOUNTER — Ambulatory Visit
Admission: RE | Admit: 2017-05-10 | Discharge: 2017-05-10 | Disposition: A | Discharge status: Home / Self Care | Payer: PRIVATE HEALTH INSURANCE | Source: Ambulatory Visit | Attending: Internal Medicine | Admitting: Internal Medicine

## 2017-05-10 ENCOUNTER — Other Ambulatory Visit: Payer: Self-pay | Admitting: Internal Medicine

## 2017-05-10 DIAGNOSIS — S0990XS Unspecified injury of head, sequela: Secondary | ICD-10-CM

## 2017-05-10 HISTORY — PX: ORIF CALCANEAL FRACTURE: SUR921

## 2017-06-05 ENCOUNTER — Emergency Department (HOSPITAL_COMMUNITY): Payer: PRIVATE HEALTH INSURANCE

## 2017-06-05 ENCOUNTER — Emergency Department (HOSPITAL_COMMUNITY)
Admission: EM | Admit: 2017-06-05 | Discharge: 2017-06-05 | Disposition: A | Discharge status: Home / Self Care | Payer: PRIVATE HEALTH INSURANCE | Attending: Emergency Medicine | Admitting: Emergency Medicine

## 2017-06-05 ENCOUNTER — Encounter (HOSPITAL_COMMUNITY): Payer: Self-pay | Admitting: Emergency Medicine

## 2017-06-05 DIAGNOSIS — Y998 Other external cause status: Secondary | ICD-10-CM | POA: Diagnosis not present

## 2017-06-05 DIAGNOSIS — W231XXA Caught, crushed, jammed, or pinched between stationary objects, initial encounter: Secondary | ICD-10-CM | POA: Insufficient documentation

## 2017-06-05 DIAGNOSIS — S92001A Unspecified fracture of right calcaneus, initial encounter for closed fracture: Secondary | ICD-10-CM | POA: Insufficient documentation

## 2017-06-05 DIAGNOSIS — Y939 Activity, unspecified: Secondary | ICD-10-CM | POA: Insufficient documentation

## 2017-06-05 DIAGNOSIS — Y929 Unspecified place or not applicable: Secondary | ICD-10-CM | POA: Diagnosis not present

## 2017-06-05 DIAGNOSIS — S99921A Unspecified injury of right foot, initial encounter: Secondary | ICD-10-CM | POA: Diagnosis present

## 2017-06-05 DIAGNOSIS — Z87891 Personal history of nicotine dependence: Secondary | ICD-10-CM | POA: Diagnosis not present

## 2017-06-05 DIAGNOSIS — Z79899 Other long term (current) drug therapy: Secondary | ICD-10-CM | POA: Insufficient documentation

## 2017-06-05 MED ORDER — OXYCODONE-ACETAMINOPHEN 5-325 MG PO TABS: 1.0000 | ORAL_TABLET | Freq: Three times a day (TID) | ORAL | 0 refills | Status: DC | PRN

## 2017-06-05 MED ORDER — HYDROMORPHONE HCL 1 MG/ML IJ SOLN
1.0000 mg | Freq: Once | INTRAMUSCULAR | Status: AC
  Administered 2017-06-05: 1 mg via INTRAMUSCULAR
  Filled 2017-06-05: qty 1

## 2017-06-05 MED ORDER — OXYCODONE-ACETAMINOPHEN 5-325 MG PO TABS
1.0000 | ORAL_TABLET | Freq: Once | ORAL | Status: AC
  Administered 2017-06-05: 1 via ORAL
  Filled 2017-06-05: qty 1

## 2017-06-05 NOTE — ED Triage Notes (Signed)
Pt reports fell off ladder about 8 ft causing injury to right ankle. Pt denies + LOC or injury to head.

## 2017-06-05 NOTE — Progress Notes (Signed)
Orthopedic Tech Progress Note Patient Details:  William Horton September 11, 1975 [id number]  Ortho Devices Type of Ortho Device: Ace wrap, Post (short leg) splint, Crutches Ortho Device/Splint Location: RLE Ortho Device/Splint Interventions: Ordered, Application, Adjustment   Braulio Bosch 06/05/2017, 6:24 PM

## 2017-06-05 NOTE — ED Provider Notes (Signed)
Received signout from [place]. Briefly, patient is a 41 year old male who sustained injury to the right foot after falling off a ladder.-ray showed comminuted calcaneal fracture. Orthopedics was consulted, they recommended obtaining a CT, placing a short leg splint and follow-up in the office in the next 1-2 days. Awaiting CT scan.  6:32 PM CT scan shows highly comminuted, mildly displaced, and impacted fracture of the right calcaneus. Patient is resting comfortably in no distress. The padded splint was applied. Discussed pain control, avoidance of weightbearing, and importance of elevation and ice for the next couple of days. He knows to follow up with orthopedics in the next 48 hours or so. Discussed indications for return to the ED. Patient and patient's wife verbalize understanding of and agreement with plan and patient is stable for discharge home at this time.   Renita Papa, PA-C 06/05/17 [id number]    Pattricia Boss, MD 06/06/17 [phone]

## 2017-06-05 NOTE — ED Provider Notes (Signed)
[facility] EMERGENCY DEPARTMENT Provider Note   CSN: [id number] Arrival date & time: 06/05/17  1452     History   Chief Complaint Chief Complaint  Patient presents with  . Ankle Injury    HPI Ryott Rafferty is a 41 y.o. male presents to ED for evaluation of right foot pain after fall that occurred prior to arrival. States the pain is throbbing and rated at 10/10. He states that he was on a ladder when he felt it being unsteady. He states that his foot got caught between the rungs and he fell forward and landed on his foot. He denies any head injury or loss of consciousness. He has not tried ambulating since the incident occurred. He denies any back pain, numbness in legs, vision changes, nausea, vomiting or other symptoms at this time.  HPI  History reviewed. No pertinent past medical history.  Patient Active Problem List   Diagnosis Date Noted  . Abdominal pain, right upper quadrant 06/20/2013    Past Surgical History:  Procedure Laterality Date  . testicular cyst removal         Home Medications    Prior to Admission medications   Medication Sig Start Date End Date Taking? Authorizing Provider  beta carotene w/minerals (OCUVITE) tablet Take 1 tablet by mouth daily.    [provider]  Calcium Carbonate-Vitamin D (CALCIUM + D PO) Take 1 tablet by mouth daily.    [provider]  Cholecalciferol (VITAMIN D PO) Take 1 tablet by mouth daily.    [provider]  ibuprofen (ADVIL,MOTRIN) 200 MG tablet Take 600 mg by mouth every 6 (six) hours as needed for fever.    [provider]  Multiple Vitamin (MULTIVITAMIN WITH MINERALS) TABS tablet Take 1 tablet by mouth daily.    [provider]  Testosterone (ANDROGEL PUMP) 20.25 MG/ACT (1.62%) GEL Place 3 Act onto the skin daily.    [provider]    Family History Family History  Problem Relation Age of Onset  . Prostate cancer Father   . Colon cancer  Neg Hx     Social History Social History  Substance Use Topics  . Smoking status: Former Smoker    Years: 10.00    Types: Cigarettes  . Smokeless tobacco: Former Systems developer    Types: Snuff  . Alcohol use Yes     Comment: occ     Allergies   Patient has no known allergies.   Review of Systems Review of Systems  Constitutional: Negative for chills and fever.  Gastrointestinal: Negative for nausea and vomiting.  Musculoskeletal: Positive for arthralgias and gait problem. Negative for joint swelling and myalgias.  Skin: Negative for color change and wound.  Neurological: Negative for weakness and numbness.     Physical Exam Updated Vital Signs BP (!) 111/46 (BP Location: Right Arm)   Pulse 87   Temp 98.2 F (36.8 C) (Oral)   Resp 17   Ht 5\' 11"  (1.803 m)   Wt 74.8 kg (165 lb)   SpO2 100%   BMI 23.01 kg/m   Physical Exam  Constitutional: He appears well-developed and well-nourished. No distress.  HENT:  Head: Normocephalic and atraumatic.  Eyes: Conjunctivae and EOM are normal. No scleral icterus.  Neck: Normal range of motion.  Pulmonary/Chest: Effort normal. No respiratory distress.  Musculoskeletal: He exhibits tenderness. He exhibits no edema or deformity.       Feet:  No visible edema or open wounds noted. Sensation intact  to light touch in bilateral lower extremities. 2+ DP pulse.  No midline spinal tenderness present in lumbar, thoracic or cervical spine. No step-off palpated. No visible bruising, edema or temperature change noted. No objective signs of numbness present. No saddle anesthesia. 2+ DP pulses bilaterally. Sensation intact to light touch. Strength 5/5 in bilateral lower extremities.  Neurological: He is alert.  Skin: No rash noted. He is not diaphoretic.  Psychiatric: He has a normal mood and affect.  Nursing note and vitals reviewed.    ED Treatments / Results  Labs (all labs ordered are listed, but only abnormal results are displayed) Labs  Reviewed - No data to display  EKG  EKG Interpretation None       Radiology Dg Ankle Complete Right  Result Date: 06/05/2017 CLINICAL DATA:  Fall 8 feet from ladder with right ankle injury. EXAM: RIGHT ANKLE - COMPLETE 3+ VIEW COMPARISON:  None. FINDINGS: Examination demonstrates a minimally displaced slightly comminuted fracture of the calcaneus predominant involving the mid to posterior portion. There is approximate 10-20% loss of height of the mid to posterior calcaneus. Remainder the exam is unremarkable. IMPRESSION: Minimally displaced comminuted fracture of the mid to posterior aspect of the calcaneus. Electronically Signed   By: Marin Olp M.D.   On: 06/05/2017 15:53    Procedures Procedures (including critical care time)  Medications Ordered in ED Medications - No data to display   Initial Impression / Assessment and Plan / ED Course  I have reviewed the triage vital signs and the nursing notes.  Pertinent labs & imaging results that were available during my care of the patient were reviewed by me and considered in my medical decision making (see chart for details).     Patient presents to ED for evaluation of right foot pain after fall. He states that his foot got caught between the rungs on a ladder when he fell 4 and landed on his foot. He denies any head injury, loss of consciousness, back pain. He has not tried ambulating since the injury occurred. On physical exam there is tenderness to palpation and mild edema noted around the bottom of the right foot. There is no visible or open wounds or tenting of the skin noted. Sensation intact to light touch in bilateral lower extremities. He has no midline spinal tenderness and denies any back pain, so I have low suspicion for fracture of the vertebrae based on the extent of his injury. Patient does not feel that imaging of his back is warranted at this time. X-ray of the foot showed evidence of displaced, comminuted fracture of  the calcaneus. Spoke to orthopedist who recommends that we obtain CT, place patient in a padded, short leg splint and advised to follow-up with him in office in the next 1-2 days. Pain control here in the ED. Will discharge patient home with pain medication to take. Care signed out to oncoming provider Rodell Perna, PA-C pending CT. Patient updated on plan and agrees to plan.   Final Clinical Impressions(s) / ED Diagnoses   Final diagnoses:  None    New Prescriptions New Prescriptions   No medications on file     Delia Heady, PA-C 06/05/17 1707    Pattricia Boss, MD 06/06/17 1555

## 2017-06-05 NOTE — ED Notes (Signed)
Patient in xray 

## 2017-06-05 NOTE — Discharge Instructions (Signed)
Please read attached information regarding splint care. Follow-up with orthopedist for further evaluation. Ice and elevate the extremity as directed. Take Percocet as needed for pain. Return to ED for worsening pain, numbness and leg, additional injuries.

## 2018-09-01 ENCOUNTER — Encounter: Payer: Self-pay | Admitting: Neurology

## 2018-09-06 ENCOUNTER — Encounter: Payer: Self-pay | Admitting: Neurology

## 2018-09-06 ENCOUNTER — Ambulatory Visit: Payer: PRIVATE HEALTH INSURANCE | Admitting: Neurology

## 2018-09-06 VITALS — BP 110/67 | HR 77 | Ht 71.0 in | Wt 190.0 lb

## 2018-09-06 DIAGNOSIS — M2619 Other specified anomalies of jaw-cranial base relationship: Secondary | ICD-10-CM | POA: Diagnosis not present

## 2018-09-06 DIAGNOSIS — R41840 Attention and concentration deficit: Secondary | ICD-10-CM

## 2018-09-06 DIAGNOSIS — Q984 Klinefelter syndrome, unspecified: Secondary | ICD-10-CM | POA: Diagnosis not present

## 2018-09-06 DIAGNOSIS — R0683 Snoring: Secondary | ICD-10-CM | POA: Diagnosis not present

## 2018-09-06 NOTE — Progress Notes (Signed)
SLEEP MEDICINE CLINIC   Provider:  Larey Seat, MD   Primary Care Physician:  Marton Redwood, MD   Referring Provider: Marton Redwood, MD    Chief Complaint  Patient presents with  . New Patient (Initial Visit)    pt alone, rm 11. pt states that his wife has told him he snores and so he is here to make sure there isnt any apnea, he states that snoring is not all the time, seems to be more associated when he is tired or have had alcohol or doesnt excercise. never had a sleep study before.     HPI:  William Horton is a 43 y.o. male patient with a chief concern of snoring, not insomnia, rather hypersomnia.  His wife is worried and bothered by his snoring. He is seen here on 09-06-2018 in a referral  from Dr. Brigitte Pulse for evaluation of sleep apnea.  He has undergone nasal septoplasty 2 years ago.   Sleep habits are as follows:he works 8 AM to 5 PM. Indoors, Sales promotion account executive , no access to daylight, no window. His  dinner time around 6 PM, and helping with kid's homework. Bed time is 10 PM, after a hot shower-but wife reads in bed until 10.30 PM- and he stays awake until she switches off. He goes to sleep in a cool, quiet and dark bedroom, sleeps on his right or left. He sleeps with 2 pillows. He had used a contoured pillow, a mouth piece didn't work, a chin strap didn't work. He is trying. Has retrognathia. No nocturia. Sleeping through unless wife" kicks him " to stop his snoring.  "I am a rock hard sleeper"     Sleep medical history: Klinefelter syndrome, nasal surgery 2 years ago.  He sleeps with 2 pillows. He had used a contoured pillow, a mouth piece didn't work, a chin strap didn't work. He is trying. Has retrognathia. No nocturia. Sleeping through unless wife" kicks him " to stop his snoring.  "I am a rock hard sleeper" . He slept on the couch while recovering from an ankle fracture, used narcotics for 4-6 weeks.    Family sleep history: 2 sons with ADD/ ADHD which he suspects he shares.      Social history:  Married, 3 kids- 44, 71 and 70 years old. Full time office worker. Non smoker, ETOH : 3 drinks a month now.  caffeine : 1 cup of coffee in the morning, 2 to 3 cups during the workday but not likely  later than 3 PM.  Review of Systems: Out of a complete 14 system review, the patient complains of only the following symptoms, and all other reviewed systems are negative. snoring, attention deficit.  Klinefelter syndrome.   Epworth score: 0 , Fatigue severity score 17 , depression score n/a    Social History   Socioeconomic History  . Marital status: Married    Spouse name: Not on file  . Number of children: 3  . Years of education: Not on file  . Highest education level: Not on file  Occupational History  . Occupation: triad Production designer, theatre/television/film: [employer]  Social Needs  . Financial resource strain: Not on file  . Food insecurity:    Worry: Not on file    Inability: Not on file  . Transportation needs:    Medical: Not on file    Non-medical: Not on file  Tobacco Use  . Smoking status: Former Smoker    Years: 10.00  Types: Cigarettes    Last attempt to quit: 2008    Years since quitting: 12.0  . Smokeless tobacco: Former Systems developer    Types: Snuff  Substance and Sexual Activity  . Alcohol use: Yes    Comment: occasionally/socially  . Drug use: No  . Sexual activity: Not on file  Lifestyle  . Physical activity:    Days per week: Not on file    Minutes per session: Not on file  . Stress: Not on file  Relationships  . Social connections:    Talks on phone: Not on file    Gets together: Not on file    Attends religious service: Not on file    Active member of club or organization: Not on file    Attends meetings of clubs or organizations: Not on file    Relationship status: Not on file  . Intimate partner violence:    Fear of current or ex partner: Not on file    Emotionally abused: Not on file    Physically abused: Not on file     Forced sexual activity: Not on file  Other Topics Concern  . Not on file  Social History Narrative  . Not on file    Family History  Problem Relation Age of Onset  . Prostate cancer Father   . Lung cancer Mother   . Melanoma Sister   . Heart attack Paternal Grandfather   . Colon cancer Neg Hx     Past Medical History:  Diagnosis Date  . Hemorrhoids   . HLD (hyperlipidemia)   . Hypogonadism male    on testosterone replacement  . Klinefelter syndrome     Past Surgical History:  Procedure Laterality Date  . NASAL SEPTUM SURGERY    . ORIF CALCANEAL FRACTURE Right 05/2017  . testicular cyst removal      Current Outpatient Medications  Medication Sig Dispense Refill  . beta carotene w/minerals (OCUVITE) tablet Take 1 tablet by mouth daily.    . Calcium Carbonate-Vitamin D (CALCIUM + D PO) Take 1 tablet by mouth daily.    . Cholecalciferol (VITAMIN D PO) Take 1 tablet by mouth daily.    . cyclobenzaprine (FLEXERIL) 10 MG tablet TK 1 T PO Q 8 H PRF MSP    . ibuprofen (ADVIL,MOTRIN) 200 MG tablet Take 600 mg by mouth every 6 (six) hours as needed for fever.    . Multiple Vitamin (MULTIVITAMIN WITH MINERALS) TABS tablet Take 1 tablet by mouth daily.    Marland Kitchen OVER THE COUNTER MEDICATION 1,000 mg at bedtime. CBD lotion, uses nightly on heel.    . Testosterone (ANDROGEL PUMP) 20.25 MG/ACT (1.62%) GEL Place 3 Act onto the skin daily.     No current facility-administered medications for this visit.     Allergies as of 09/06/2018  . (No Known Allergies)    Vitals: BP 110/67   Pulse 77   Ht 5\' 11"  (1.803 m)   Wt 190 lb (86.2 kg)   BMI 26.50 kg/m  Last Weight:  Wt Readings from Last 1 Encounters:  09/06/18 190 lb (86.2 kg)   WVP:XTGG mass index is 26.5 kg/m.     Last Height:   Ht Readings from Last 1 Encounters:  09/06/18 5\' 11"  (1.803 m)   All normal labs- 10-27-2017, dr. Brigitte Pulse , reviewed with patient .  Physical exam:  General: The patient is awake, alert and appears  not in acute distress. The patient is well groomed. Head: Normocephalic, atraumatic. Neck  is supple. Mallampati:   neck circumference:17. Nasal airflow patent ,   Retrognathia is seen.  Cardiovascular:  Regular rate and rhythm, without murmurs or carotid bruit, and without distended neck veins. Respiratory: Lungs are clear to auscultation. Skin:  Without evidence of edema, or rash Trunk: BMI is 26.5 . The patient's posture is erect.    Neurologic exam : The patient is awake and alert, oriented to place and time.   Attention span & concentration ability appears limited.  Speech is fluent,  without dysarthria, dysphonia or aphasia.  Mood and affect are appropriate.  Cranial nerves: Pupils are equal and briskly reactive to light. Funduscopic exam deferred. Extraocular movements  in vertical and horizontal planes intact and without nystagmus. Visual fields by finger perimetry are intact.Hearing to finger rub intact.  Facial sensation intact to fine touch.  Facial motor strength is symmetric and tongue and uvula move midline. Shoulder shrug was symmetrical.   Motor exam:   Normal tone, muscle bulk and symmetric strength in all extremities. Sensory:  Fine touch, pinprick and vibration were tested in all extremities. Proprioception tested in the upper extremities was normal. Coordination: no changes in penmanship.  Rapid alternating movements in the fingers/hands was normal. Finger-to-nose maneuver  normal without evidence of ataxia, dysmetria or tremor. Gait and station: Patient walks without assistive device and is able unassisted to climb up to the exam table. Strength within normal limits. Deep tendon reflexes: in the  upper and lower extremities are symmetric and intact.   Assessment:  After physical and neurologic examination, review of laboratory studies,  Personal review of imaging studies, reports of other /neurophysiology testing and pre-existing records as far as provided in visit., my  assessment is   1) I will start with an apnea evaluation by sleep PSG. If [facility] does not approve, will do HST.      2) I recommend to see Dr. Missy Sabins for ADD/ ADHD treatment.    3) Klinefelter would affect muscle tone as a condition associated with lower testosterone.    The patient was advised of the nature of the diagnosed disorder , the treatment options and the  risks for general health and wellness arising from not treating the condition.   I spent more than 45  minutes of face to face time with the patient.  Greater than 50% of time was spent in counseling and coordination of care. We have discussed the diagnosis and differential and I answered the patient's questions.      Larey Seat, MD 02/18/1976, 8:83 AM  Certified in Neurology by ABPN Certified in [place] by [facility] [address], [place],  [address]

## 2019-02-21 ENCOUNTER — Ambulatory Visit (INDEPENDENT_AMBULATORY_CARE_PROVIDER_SITE_OTHER): Payer: PRIVATE HEALTH INSURANCE | Admitting: Neurology

## 2019-02-21 DIAGNOSIS — G4710 Hypersomnia, unspecified: Secondary | ICD-10-CM

## 2019-02-21 DIAGNOSIS — R41840 Attention and concentration deficit: Secondary | ICD-10-CM

## 2019-02-21 DIAGNOSIS — M2619 Other specified anomalies of jaw-cranial base relationship: Secondary | ICD-10-CM

## 2019-02-21 DIAGNOSIS — Q984 Klinefelter syndrome, unspecified: Secondary | ICD-10-CM

## 2019-02-21 DIAGNOSIS — R0683 Snoring: Secondary | ICD-10-CM

## 2019-02-28 DIAGNOSIS — M2619 Other specified anomalies of jaw-cranial base relationship: Secondary | ICD-10-CM | POA: Insufficient documentation

## 2019-02-28 DIAGNOSIS — R0683 Snoring: Secondary | ICD-10-CM | POA: Insufficient documentation

## 2019-02-28 NOTE — Procedures (Signed)
PATIENT'S NAME:  William Horton, William Horton DOB:      Mar 10, 1976      MR#:    [id number]     DATE OF RECORDING: 02/21/2019  AL REFERRING M.D.:  William Redwood, MD Study Performed:   Baseline Polysomnogram HISTORY:  43 y.o. male patient with a chief concern of snoring, not insomnia, rather hypersomnia.  His wife is worried and bothered by his snoring.  He was seen on 09-06-2018 in a referral from Dr. Brigitte Horton for evaluation of sleep apnea.  He has undergone nasal septoplasty 2 years ago.    Sleep medical history: Klinefelter syndrome, nasal surgery 2 years ago. He sleeps with 2 pillows. He had used a contoured pillow, a mouth piece didn't work, a chin strap didn't work. Has retrognathia, denies nocturia. Sleeping through unless wife" kicks him" to stop his snoring.    The patient endorsed the Epworth Sleepiness Scale at 0 points.   The patient's weight 190 pounds with a height of 71 (inches), resulting in a BMI of 26.5 kg/m2. The patient's neck circumference measured 17 inches.  CURRENT MEDICATIONS: Flexeril, Advil, Androgel   PROCEDURE:  This is a multichannel digital polysomnogram utilizing the Somnostar 11.2 system.  Electrodes and sensors were applied and monitored per AASM Specifications.   EEG, EOG, Chin and Limb EMG, were sampled at 200 Hz.  ECG, Snore and Nasal Pressure, Thermal Airflow, Respiratory Effort, CPAP Flow and Pressure, Oximetry was sampled at 50 Hz. Digital video and audio were recorded.      BASELINE STUDY: Lights Out was at 22:40 and Lights On at 04:41.  Total recording time (TRT) was 361.5 minutes, with a total sleep time (TST) of 194 minutes.  The patient's sleep latency was 62.5 minutes.  REM latency was 0 minutes.  The sleep efficiency was 53.7 %.     SLEEP ARCHITECTURE: WASO (Wake after sleep onset) was 40 minutes.  There were 24.5 minutes in Stage N1, 80 minutes Stage N2, 89.5 minutes Stage N3 and 0 minutes in Stage REM.  The percentage of Stage N1 was 12.6%, Stage N2 was 41.2%, Stage N3  was 46.1% and Stage R (REM sleep) was 0%.    RESPIRATORY ANALYSIS:  There were a total of 3 respiratory events:  0 obstructive apneas, 0 central apneas and 0 mixed apneas with a total of 0 apneas and an apnea index (AI) of 0 /hour. There were 3 hypopneas with a hypopnea index of 0.9 /hour.    The total APNEA/HYPOPNEA INDEX (AHI) was 0.9 /hour. 0 events occurred in REM sleep and 6 events in NREM. The REM AHI was 0 /hour, versus a non-REM AHI of .9. The patient spent 119 minutes of total sleep time in the supine position and 75 minutes in non-supine. The supine AHI was 1.0 versus a non-supine AHI of 0.8.  OXYGEN SATURATION & C02:  The Wake baseline 02 saturation was 94%, with the lowest being 87%. Time spent below 89% saturation equaled 1 minute. The arousals were noted as: 52 were spontaneous, 0 were associated with PLMs, and 2 were associated with respiratory events. There was loud snoring. The patient had a total of 0 Periodic Limb Movements.   Audio and video analysis did not show any abnormal or unusual movements, behaviors, phonations or vocalizations.   The patient took one bathroom break. EKG was in keeping with normal sinus rhythm (NSR).  Post-study, the patient indicated that sleep was shorter and of lower quality than usual.   IMPRESSION:  1. Primary Snoring  but no sleep apnea found, nor hypoxemia.  2. Fragmented sleep with spontaneous arousals, few related to snoring.    RECOMMENDATIONS:  I can correlate some of the arousals that fragmented William Horton sleep to snoring or UARS, but not to frank apnea.  The arousals can be either related to pain to to depression, sometimes due to a lingering stimulant substance or their metabolites.   Since a dental device had been tried, and frank apnea is not found, there is no further intervention / therapy recommendation.     I certify that I have reviewed the entire raw data recording prior to the issuance of this report in accordance with the  Standards of Accreditation of the American Academy of Sleep Medicine (AASM)     William Seat, MD    02-28-2019 Diplomat, American Board of Psychiatry and Neurology  Diplomat, American Board of [place] Director, [place] [facility] [employer]  l

## 2019-03-06 ENCOUNTER — Telehealth: Payer: Self-pay | Admitting: Neurology

## 2019-03-06 NOTE — Telephone Encounter (Signed)
-----   Message from Larey Seat, MD sent at 02/28/2019  6:36 PM EDT ----- IMPRESSION:  1. Primary Snoring but no sleep apnea found, nor hypoxemia.  2. Fragmented sleep with spontaneous arousals, few related to snoring.    RECOMMENDATIONS:  I can correlate some of the arousals that fragmented William Horton sleep to snoring or UARS, but not to frank apnea.  The arousals can be either related to pain to to depression, sometimes due to a lingering stimulant substance or their metabolites.   Since a dental device had been tried and frank apnea was not found, there is no further intervention / therapy recommendation.  I cannot indicate an organic sleep disorder. Follow up in sleep clinic is optional.

## 2019-03-06 NOTE — Telephone Encounter (Signed)
Called the patient to advise of the sleep study findings. Advised the patient that there was no evidence of sleep apnea found. Informed the patient no identification of any organic sleep disorder found. Patient has tried dental device, a chin strap, a nora back pillow. He has seen ENT previously before and they stated no blockage. Patient is unsure how to help with snoring. Advised patient that we could schedule a visit if he wanted to discuss further. Patient didn't at this time. Advised I will mail a copy for the patient to him.

## 2019-08-14 ENCOUNTER — Ambulatory Visit: Payer: PRIVATE HEALTH INSURANCE | Attending: Internal Medicine

## 2019-08-14 DIAGNOSIS — Z20822 Contact with and (suspected) exposure to covid-19: Secondary | ICD-10-CM

## 2019-08-15 LAB — NOVEL CORONAVIRUS, NAA: SARS-CoV-2, NAA: NOT DETECTED

## 2019-09-25 ENCOUNTER — Ambulatory Visit: Payer: PRIVATE HEALTH INSURANCE | Attending: Internal Medicine

## 2019-09-25 DIAGNOSIS — Z23 Encounter for immunization: Secondary | ICD-10-CM | POA: Insufficient documentation

## 2019-09-25 NOTE — Progress Notes (Signed)
   Covid-19 Vaccination Clinic  Name:  Hiromu Adler    MRN: [id number] DOB: June 16, 1976  09/25/2019  Mr. Brownlow was observed post Covid-19 immunization for 15 minutes without incidence. He was provided with Vaccine Information Sheet and instruction to access the V-Safe system.   Mr. Cauthon was instructed to call 911 with any severe reactions post vaccine: Marland Kitchen Difficulty breathing  . Swelling of your face and throat  . A fast heartbeat  . A bad rash all over your body  . Dizziness and weakness    Immunizations Administered    Name Date Dose VIS Date Route   Pfizer COVID-19 Vaccine 09/25/2019  6:46 PM 0.3 mL 07/21/2019 Intramuscular   Manufacturer: [place]   Lot: [id number]   [place]: [id number]

## 2019-10-17 ENCOUNTER — Ambulatory Visit: Payer: PRIVATE HEALTH INSURANCE | Attending: Internal Medicine

## 2019-10-17 DIAGNOSIS — Z23 Encounter for immunization: Secondary | ICD-10-CM | POA: Insufficient documentation

## 2019-10-17 NOTE — Progress Notes (Signed)
   Covid-19 Vaccination Clinic  Name:  William Horton    MRN: [id number] DOB: 1976/03/22  10/17/2019  Mr. William Horton was observed post Covid-19 immunization for 15 minutes. Complained of dizziness upon standing and walking. Was asked to sit down to be observed further. Was given water and crackers. Blood pressure 124/84, pulse 63, resps 16 @ 1600. After 15 additional minutes, said he was feeling better and ready to leave. Checked out at desk and walked out without incident.   He was provided with Vaccine Information Sheet and instruction to access the V-Safe system.   Mr. William Horton was instructed to call 911 with any severe reactions post vaccine: Marland Kitchen Difficulty breathing  . Swelling of face and throat  . A fast heartbeat  . A bad rash all over body  . Dizziness and weakness   Immunizations Administered    Name Date Dose VIS Date Route   Pfizer COVID-19 Vaccine 10/17/2019  3:36 PM 0.3 mL 07/21/2019 Intramuscular   Manufacturer: [place]   Lot: [id number]   [place]: [id number]

## 2019-10-18 ENCOUNTER — Ambulatory Visit: Payer: PRIVATE HEALTH INSURANCE

## 2020-12-17 ENCOUNTER — Telehealth: Payer: Self-pay | Admitting: Gastroenterology

## 2020-12-17 NOTE — Telephone Encounter (Signed)
William Horton is a friend of mine and used to liver across the street from Dr. Carlean Purl. He has chronic GERD and is at routine risk for colon cancer. Would like a NGI ov with any provider (except DJ or CG) to discuss his GERD, consider colonoscopy and EGD.  His cell number is [phone].   Can you contact him to help schedule? he would probably be OK with extender visit as long as not staffed by me or Dr. Carlean Purl.  Thanks

## 2020-12-17 NOTE — Telephone Encounter (Signed)
appt made for 01/21/21 at 830 am with Dr Havery Moros  Left message on machine to call back

## 2020-12-18 NOTE — Telephone Encounter (Signed)
The pt has been advised of the appt date and time.

## 2021-01-21 ENCOUNTER — Ambulatory Visit: Payer: PRIVATE HEALTH INSURANCE | Admitting: Gastroenterology

## 2021-01-21 ENCOUNTER — Ambulatory Visit (INDEPENDENT_AMBULATORY_CARE_PROVIDER_SITE_OTHER): Payer: 59 | Admitting: Gastroenterology

## 2021-01-21 ENCOUNTER — Other Ambulatory Visit (INDEPENDENT_AMBULATORY_CARE_PROVIDER_SITE_OTHER): Payer: 59

## 2021-01-21 VITALS — BP 104/70 | HR 78 | Ht 71.0 in | Wt 182.2 lb

## 2021-01-21 DIAGNOSIS — R112 Nausea with vomiting, unspecified: Secondary | ICD-10-CM | POA: Diagnosis not present

## 2021-01-21 DIAGNOSIS — R7401 Elevation of levels of liver transaminase levels: Secondary | ICD-10-CM | POA: Diagnosis not present

## 2021-01-21 DIAGNOSIS — Z1211 Encounter for screening for malignant neoplasm of colon: Secondary | ICD-10-CM

## 2021-01-21 LAB — HEPATIC FUNCTION PANEL
ALT: 23 U/L (ref 0–53)
AST: 21 U/L (ref 0–37)
Albumin: 5.1 g/dL (ref 3.5–5.2)
Alkaline Phosphatase: 46 U/L (ref 39–117)
Bilirubin, Direct: 0.1 mg/dL (ref 0.0–0.3)
Total Bilirubin: 0.6 mg/dL (ref 0.2–1.2)
Total Protein: 8.4 g/dL — ABNORMAL HIGH (ref 6.0–8.3)

## 2021-01-21 MED ORDER — SUTAB 1479-225-188 MG PO TABS: 1.0000 | ORAL_TABLET | Freq: Once | ORAL | 0 refills | Status: AC

## 2021-01-21 NOTE — Progress Notes (Signed)
HPI :  45 year old male with a history of intermittent vomiting, hyperlipidemia, hypogonadism, referred by Marton Redwood MD to discuss symptoms of vomiting and colon cancer screening.  Patient states a few months ago he developed episodic nausea and vomiting.  This occurs mostly in the morning, has occurred while driving his car and dropping his kids off, has had to pull over to vomit.  Initially this would happen 3 to 4 days/week.  He would have onset of nausea followed by an episode of vomiting and then he would feel okay.  Denies any clear precipitants for this, occurs sporadically.  Is not preceded by a meal usually.  He is usually able to eat okay without problems.  He initially tried some Pepcid over-the-counter which really did not make much of a difference in his symptoms.  He then had a trial of omeprazole 20 mg a day.  He states perhaps that helped reduce the frequency of it somewhat but really did not stop it.  He took this for short course and then stopped it as he did not think it was making much of a difference.  He was concerned perhaps it was hot beverages such as coffee and tea that were bothering him.  He states this has perhaps helped again somewhat by holding this, but still has had some episodes despite this.  However he has not had any episodes in 1 month and has been doing better lately.  He denies any abdominal pain associated with this.  He denies any heartburn at baseline or reflux symptoms.  No dysphagia.  No weight loss.  He states he had an episode of severe abdominal pain over 15 years ago that he had a work-up for but they cannot sort out what was causing it.  He denies any problems with his bowels, no constipation or diarrhea.  No blood in his stools.  He does not use any NSAIDs on a routine basis.  He does not use alcohol with any significance, rare social usage.  No new medications he takes in association with the symptoms.  Denies routine usage of marijuana.  He has had a  history of elevated liver enzymes in the past, on review of chart it looks like he saw Dr. Deatra Ina for that in 2014 and had a serologic work-up done that was unremarkable.  Per review of labs from Dr. Brigitte Pulse, his liver enzymes were normal since 2018 but then in April of this year he had a mild ALT elevation to 62, AST 38, alk phos 45, T bill 1.8.  He is not aware of any known history of liver disease  He has never had a prior colonoscopy, no family history of colon cancer.   Past Medical History:  Diagnosis Date   GERD (gastroesophageal reflux disease)    Hemorrhoids    HLD (hyperlipidemia)    Hypogonadism male    on testosterone replacement   Klinefelter syndrome      Past Surgical History:  Procedure Laterality Date   NASAL SEPTUM SURGERY     ORIF CALCANEAL FRACTURE Right 05/2017   testicular cyst removal     Family History  Problem Relation Age of Onset   Lung cancer Mother    Prostate cancer Father    Melanoma Sister    Heart attack Paternal Grandfather    Prostate cancer Paternal Uncle    Colon cancer Neg Hx    Esophageal cancer Neg Hx    Pancreatic cancer Neg Hx    Stomach  cancer Neg Hx    Liver disease Neg Hx    Social History   Tobacco Use   Smoking status: Former    Years: 10.00    Pack years: 0.00    Types: Cigarettes    Quit date: 2008    Years since quitting: 14.4   Smokeless tobacco: Former    Types: Snuff  Vaping Use   Vaping Use: Never used  Substance Use Topics   Alcohol use: Yes    Alcohol/week: 1.0 standard drink    Types: 1 Cans of beer per week   Drug use: No   Current Outpatient Medications  Medication Sig Dispense Refill   beta carotene w/minerals (OCUVITE) tablet Take 1 tablet by mouth daily.     Cholecalciferol (VITAMIN D PO) Take 1 tablet by mouth daily.     cyclobenzaprine (FLEXERIL) 10 MG tablet TK 1 T PO Q 8 H PRF MSP     escitalopram (LEXAPRO) 10 MG tablet Take 10 mg by mouth daily.     Multiple Vitamin (MULTIVITAMIN WITH MINERALS)  TABS tablet Take 1 tablet by mouth daily.     OVER THE COUNTER MEDICATION 1,000 mg at bedtime. CBD lotion, uses nightly on heel.     Testosterone 20.25 MG/ACT (1.62%) GEL Place 3 Act onto the skin daily.     No current facility-administered medications for this visit.   No Known Allergies   Review of Systems: All systems reviewed and negative except where noted in HPI.   Labs reviewed per Dr Raul Del records  Physical Exam: BP 104/70   Pulse 78   Ht 5' 11" (1.803 m)   Wt 182 lb 3.2 oz (82.6 kg)   SpO2 98%   BMI 25.41 kg/m  Constitutional: Pleasant,well-developed, male in no acute distress. HEENT: Normocephalic and atraumatic. Conjunctivae are normal. No scleral icterus. Neck supple.  Cardiovascular: Normal rate, regular rhythm.  Pulmonary/chest: Effort normal and breath sounds normal.  Abdominal: Soft, nondistended, nontender. There are no masses palpable.  Extremities: no edema Lymphadenopathy: No cervical adenopathy noted. Neurological: Alert and oriented to person place and time. Skin: Skin is warm and dry. No obvious rashes noted. Psychiatric: Normal mood and affect. Behavior is normal.   ASSESSMENT AND PLAN: 45 year old male here for new patient assessment of the following:  Nausea / vomiting Elevated ALT Colon cancer screening  As above, episodic nausea and vomiting without any clear precipitants over the past few months. No other associated symptoms. Over time it has not occurred as frequently as it has before, but not resolved.  He seems to think drinking coffee and tea/hot beverages may induce this but it has occurred when he has not consumed that.  We discussed possibilities.  I think an endoscopy is a reasonable to clear his upper tract given the symptoms have been going on for a few months now.  I discussed endoscopy with him, risks benefits of the procedure and anesthesia, he wants to proceed.  His initial labs look okay other than a mildly elevated ALT.  He has  had this elevated years ago but had since normalized.  I will recheck his LFTs today and if persistently elevated will pursue an ultrasound of the right upper quadrant.  He otherwise has no abdominal pain or symptoms concerning for biliary colic.  We discussed colon cancer screening, he has not had any before, no lower tract symptoms.  I offered him an optical colonoscopy to be done at the same time as his upper endoscopy and he  wanted to proceed with that following discussion of risks and benefits.  In the interim if he wishes to try resuming coffee and see if this is associated with his symptoms at all he can do so.  I offered him some Zofran to use as needed for the nausea when he gets it to try to abort an episode but he declines for now.  We will await his course and results of his procedures, further recommendations pending the results.  He will call me in the interim if any worsening.  Plan: - scheduled for EGD and colonoscopy - can resume coffee / hot beverages and see if related to his symptoms at all - LFTs today, if persistently elevated will pursue RUQ [place] - offered Zofran, he declines for now  Further recommendations based on results of his workup and course.   Packwood Cellar, MD [facility]  CC: Ginger Organ., MD

## 2021-01-21 NOTE — Patient Instructions (Addendum)
If you are age 47 or older, your body mass index should be between 23-30. Your Body mass index is 25.41 kg/m. If this is out of the aforementioned range listed, please consider follow up with your Primary Care Provider.  If you are age 25 or younger, your body mass index should be between 19-25. Your Body mass index is 25.41 kg/m. If this is out of the aformentioned range listed, please consider follow up with your Primary Care Provider.   __________________________________________________________  The [facility] providers would like to encourage you to use [facility] to communicate with providers for non-urgent requests or questions.  Due to long hold times on the telephone, sending your provider a message by [facility] may be a faster and more efficient way to get a response.  Please allow 48 business hours for a response.  Please remember that this is for non-urgent requests.  __________________________________________________________  Dennis Bast have been scheduled for an endoscopy and colonoscopy. Please follow the written instructions given to you at your visit today. Please pick up your prep supplies at the pharmacy within the next 1-3 days. If you use inhalers (even only as needed), please bring them with you on the day of your procedure.   Please go to the lab in the basement of our building to have lab work done as you leave today. Hit "B" for basement when you get on the elevator.  When the doors open the lab is on your left.  We will call you with the results. Thank you.   Thank you for entrusting me with your care and for choosing [facility], Dr. Russell Cellar

## 2021-04-22 ENCOUNTER — Other Ambulatory Visit: Payer: Self-pay

## 2021-04-22 ENCOUNTER — Ambulatory Visit (AMBULATORY_SURGERY_CENTER): Payer: 59 | Admitting: Gastroenterology

## 2021-04-22 ENCOUNTER — Encounter: Payer: Self-pay | Admitting: Gastroenterology

## 2021-04-22 VITALS — BP 99/61 | HR 61 | Temp 97.7°F | Resp 13 | Ht 71.0 in | Wt 182.0 lb

## 2021-04-22 DIAGNOSIS — D122 Benign neoplasm of ascending colon: Secondary | ICD-10-CM

## 2021-04-22 DIAGNOSIS — K319 Disease of stomach and duodenum, unspecified: Secondary | ICD-10-CM

## 2021-04-22 DIAGNOSIS — K2970 Gastritis, unspecified, without bleeding: Secondary | ICD-10-CM

## 2021-04-22 DIAGNOSIS — K3189 Other diseases of stomach and duodenum: Secondary | ICD-10-CM

## 2021-04-22 DIAGNOSIS — D125 Benign neoplasm of sigmoid colon: Secondary | ICD-10-CM

## 2021-04-22 DIAGNOSIS — Z1211 Encounter for screening for malignant neoplasm of colon: Secondary | ICD-10-CM

## 2021-04-22 DIAGNOSIS — R112 Nausea with vomiting, unspecified: Secondary | ICD-10-CM

## 2021-04-22 DIAGNOSIS — K317 Polyp of stomach and duodenum: Secondary | ICD-10-CM

## 2021-04-22 MED ORDER — SODIUM CHLORIDE 0.9 % IV SOLN: 500.0000 mL | Freq: Once | INTRAVENOUS | Status: DC

## 2021-04-22 NOTE — Op Note (Signed)
[facility] Patient Name: William Horton Procedure Date: 04/22/2021 8:55 AM MRN: [id number] Endoscopist: Remo Lipps P. Havery Moros , MD Age: 45 Referring MD:  Date of Birth: October 22, 1975 Gender: Male Account #: [id number] Procedure:                Upper GI endoscopy Indications:              Nausea with vomiting - prior intolerance to hot                            beverages, since clinic visit doing better Medicines:                Monitored Anesthesia Care Procedure:                Pre-Anesthesia Assessment:                           - Prior to the procedure, a History and Physical                            was performed, and patient medications and                            allergies were reviewed. The patient's tolerance of                            previous anesthesia was also reviewed. The risks                            and benefits of the procedure and the sedation                            options and risks were discussed with the patient.                            All questions were answered, and informed consent                            was obtained. Prior Anticoagulants: The patient has                            taken no previous anticoagulant or antiplatelet                            agents. ASA Grade Assessment: II - A patient with                            mild systemic disease. After reviewing the risks                            and benefits, the patient was deemed in                            satisfactory condition to undergo the procedure.  After obtaining informed consent, the endoscope was                            passed under direct vision. Throughout the                            procedure, the patient's blood pressure, pulse, and                            oxygen saturations were monitored continuously. The                            GIF [id number] was introduced through the                            mouth, and  advanced to the second part of duodenum.                            The upper GI endoscopy was accomplished without                            difficulty. The patient tolerated the procedure                            well. Scope In: Scope Out: Findings:                 Esophagogastric landmarks were identified: the                            Z-line was found at 40 cm, the gastroesophageal                            junction was found at 40 cm and the upper extent of                            the gastric folds was found at 40 cm from the                            incisors.                           The exam of the esophagus was otherwise normal.                           A single 5 mm subepithelial nodule was found in the                            cardia. Bite on biopsies were taken with a cold                            forceps for histology.                           A  few diminutive sessile polyps were found in the                            gastric body, grossly consistent with benign fundic                            gland polyps. One representative polyp was removed                            with a cold biopsy forceps. Resection and retrieval                            were complete.                           Patchy inflammation characterized by erythema and                            granularity was found in the gastric antrum.                           The exam of the stomach was otherwise normal.                           Biopsies were taken with a cold forceps in the                            gastric body, at the incisura and in the gastric                            antrum for Helicobacter pylori testing.                           Patchy mildly erythematous mucosa was found in the                            duodenal bulb and in the second portion of the                            duodenum. Biopsies for histology were taken with a                            cold forceps  for evaluation of celiac disease. The                            duodenum was otherwise normal. Complications:            No immediate complications. Estimated blood loss:                            Minimal. Estimated Blood Loss:     Estimated blood loss was minimal. Impression:               - Esophagogastric landmarks identified.                           -  Normal esophagus otherwise                           - A single small benign appearing subepithelial                            nodule found in the cardia. Biopsied.                           - A few gastric polyps, suspect benign fundic gland                            poylps. Representative polyp resected and retrieved.                           - Gastritis. Biopsied to rule out H pylori                           - Normal stomach otherwise                           - Erythematous duodenopathy. Biopsied.                           - Normal duodenum otherwise Recommendation:           - Patient has a contact number available for                            emergencies. The signs and symptoms of potential                            delayed complications were discussed with the                            patient. Return to normal activities tomorrow.                            Written discharge instructions were provided to the                            patient.                           - Resume previous diet.                           - Continue present medications.                           - Minimize NSAID use if taking NSAIDs routinely                           - If symptoms persist / recur, recommend omeprazole                            '40mg'$  / day  for 30 days                           - Await pathology results. Remo Lipps P. Tamikia Chowning, MD 04/22/2021 9:48:05 AM This report has been signed electronically.

## 2021-04-22 NOTE — Op Note (Signed)
[place] Patient Name: William Horton Procedure Date: 04/22/2021 8:55 AM MRN: [id number] Endoscopist: Remo Lipps P. Havery Moros , MD Age: 45 Referring MD:  Date of Birth: [birth year] Gender: Male Account #: [id number] Procedure:                Colonoscopy Indications:              Screening for colorectal malignant neoplasm, This                            is the patient's first colonoscopy Medicines:                Monitored Anesthesia Care Procedure:                Pre-Anesthesia Assessment:                           - Prior to the procedure, a History and Physical                            was performed, and patient medications and                            allergies were reviewed. The patient's tolerance of                            previous anesthesia was also reviewed. The risks                            and benefits of the procedure and the sedation                            options and risks were discussed with the patient.                            All questions were answered, and informed consent                            was obtained. Prior Anticoagulants: The patient has                            taken no previous anticoagulant or antiplatelet                            agents. ASA Grade Assessment: II - A patient with                            mild systemic disease. After reviewing the risks                            and benefits, the patient was deemed in                            satisfactory condition to undergo the procedure.  After obtaining informed consent, the colonoscope                            was passed under direct vision. Throughout the                            procedure, the patient's blood pressure, pulse, and                            oxygen saturations were monitored continuously. The                            [id number]-[id number] Colonoscope was introduced through the                            anus and advanced to  the the terminal ileum, with                            identification of the appendiceal orifice and IC                            valve. The colonoscopy was performed without                            difficulty. The patient tolerated the procedure                            well. The quality of the bowel preparation was                            good. The terminal ileum, ileocecal valve,                            appendiceal orifice, and rectum were photographed. Scope In: 9:19:13 AM Scope Out: 9:35:50 AM Scope Withdrawal Time: 0 hours 13 minutes 38 seconds  Total Procedure Duration: 0 hours 16 minutes 37 seconds  Findings:                 The perianal and digital rectal examinations were                            normal.                           The terminal ileum appeared normal.                           A 2 mm polyp was found in the ascending colon. The                            polyp was sessile. The polyp was removed with a                            cold snare. Resection and retrieval were complete.  A 3 mm polyp was found in the sigmoid colon. The                            polyp was sessile. The polyp was removed with a                            cold snare. Resection and retrieval were complete.                           Internal hemorrhoids were found during                            retroflexion. The hemorrhoids were moderate.                           The exam was otherwise without abnormality. Complications:            No immediate complications. Estimated blood loss:                            Minimal. Estimated Blood Loss:     Estimated blood loss was minimal. Impression:               - The examined portion of the ileum was normal.                           - One 2 mm polyp in the ascending colon, removed                            with a cold snare. Resected and retrieved.                           - One 3 mm polyp in the sigmoid  colon, removed with                            a cold snare. Resected and retrieved.                           - Internal hemorrhoids.                           - The examination was otherwise normal. Recommendation:           - Patient has a contact number available for                            emergencies. The signs and symptoms of potential                            delayed complications were discussed with the                            patient. Return to normal activities tomorrow.  Written discharge instructions were provided to the                            patient.                           - Resume previous diet.                           - Continue present medications.                           - Await pathology results. Remo Lipps P. Mattheu Brodersen, MD 04/22/2021 9:39:24 AM This report has been signed electronically.

## 2021-04-22 NOTE — Patient Instructions (Signed)
Handout on polyps, hemorrhoids, and gastritis given.  Minimize use of NSAIDS.   YOU HAD AN ENDOSCOPIC PROCEDURE TODAY AT [facility]:   Refer to the procedure report that was given to you for any specific questions about what was found during the examination.  If the procedure report does not answer your questions, please call your gastroenterologist to clarify.  If you requested that your care partner not be given the details of your procedure findings, then the procedure report has been included in a sealed envelope for you to review at your convenience later.  YOU SHOULD EXPECT: Some feelings of bloating in the abdomen. Passage of more gas than usual.  Walking can help get rid of the air that was put into your GI tract during the procedure and reduce the bloating. If you had a lower endoscopy (such as a colonoscopy or flexible sigmoidoscopy) you may notice spotting of blood in your stool or on the toilet paper. If you underwent a bowel prep for your procedure, you may not have a normal bowel movement for a few days.  Please Note:  You might notice some irritation and congestion in your nose or some drainage.  This is from the oxygen used during your procedure.  There is no need for concern and it should clear up in a day or so.  SYMPTOMS TO REPORT IMMEDIATELY:  Following lower endoscopy (colonoscopy or flexible sigmoidoscopy):  Excessive amounts of blood in the stool  Significant tenderness or worsening of abdominal pains  Swelling of the abdomen that is new, acute  Fever of 100F or higher  Following upper endoscopy (EGD)  Vomiting of blood or coffee ground material  New chest pain or pain under the shoulder blades  Painful or persistently difficult swallowing  New shortness of breath  Fever of 100F or higher  Black, tarry-looking stools  For urgent or emergent issues, a gastroenterologist can be reached at any hour by calling [phone]. Do not use MyChart  messaging for urgent concerns.    DIET:  We do recommend a small meal at first, but then you may proceed to your regular diet.  Drink plenty of fluids but you should avoid alcoholic beverages for 24 hours.  ACTIVITY:  You should plan to take it easy for the rest of today and you should NOT DRIVE or use heavy machinery until tomorrow (because of the sedation medicines used during the test).    FOLLOW UP: Our staff will call the number listed on your records 48-72 hours following your procedure to check on you and address any questions or concerns that you may have regarding the information given to you following your procedure. If we do not reach you, we will leave a message.  We will attempt to reach you two times.  During this call, we will ask if you have developed any symptoms of COVID 19. If you develop any symptoms (ie: fever, flu-like symptoms, shortness of breath, cough etc.) before then, please call [phone].  If you test positive for Covid 19 in the 2 weeks post procedure, please call and report this information to [place].    If any biopsies were taken you will be contacted by phone or by letter within the next 1-3 weeks.  Please call us at [phone] if you have not heard about the biopsies in 3 weeks.    SIGNATURES/CONFIDENTIALITY: You and/or your care partner have signed paperwork which will be entered into your electronic medical record.  These signatures attest to the fact that that the information above on your After Visit Summary has been reviewed and is understood.  Full responsibility of the confidentiality of this discharge information lies with you and/or your care-partner.  

## 2021-04-22 NOTE — Progress Notes (Signed)
Pt's states no medical or surgical changes since previsit or office visit. 

## 2021-04-22 NOTE — Progress Notes (Signed)
Called to room to assist during endoscopic procedure.  Patient ID and intended procedure confirmed with present staff. Received instructions for my participation in the procedure from the performing physician.  

## 2021-04-22 NOTE — Progress Notes (Signed)
A and O x3. Report to RN. Tolerated MAC anesthesia well.Teeth unchanged after procedure. 

## 2021-04-22 NOTE — Progress Notes (Signed)
C.W. vital signs. 

## 2021-04-22 NOTE — Progress Notes (Signed)
[facility] History and Physical   Primary Care Physician:  Ginger Organ., MD   Reason for Procedure:   Nausea / vomiting, colon cancer screening  Plan:    EGD and colonoscopy     HPI: William Horton is a 45 y.o. male  here for colonoscopy screening, no prior exams. Patient denies any bowel symptoms at this time. No family history of colon cancer known. Otherwise feels well without any cardiopulmonary symptoms.   Also here for EGD to evaluate sporadic nausea / vomiting. Use of PPI did not help. Thought it was related to hot beverages, generally has been doing better in this regard since I have last seen him. Has resumed coffee and tolerated it.   Past Medical History:  Diagnosis Date   GERD (gastroesophageal reflux disease)    Hemorrhoids    HLD (hyperlipidemia)    Hypogonadism male    on testosterone replacement   Klinefelter syndrome     Past Surgical History:  Procedure Laterality Date   NASAL SEPTUM SURGERY     ORIF CALCANEAL FRACTURE Right 05/2017   testicular cyst removal      Prior to Admission medications   Medication Sig Start Date End Date Taking? Authorizing Provider  escitalopram (LEXAPRO) 10 MG tablet Take 10 mg by mouth daily.   Yes [provider]  Multiple Vitamin (MULTIVITAMIN WITH MINERALS) TABS tablet Take 1 tablet by mouth daily.   Yes [provider]  OVER THE COUNTER MEDICATION 1,000 mg at bedtime. CBD lotion, uses nightly on heel.   Yes [provider]  Testosterone 20.25 MG/ACT (1.62%) GEL Place 3 Act onto the skin daily.   Yes [provider]  beta carotene w/minerals (OCUVITE) tablet Take 1 tablet by mouth daily.    [provider]  Cholecalciferol (VITAMIN D PO) Take 1 tablet by mouth daily.    [provider]  cyclobenzaprine (FLEXERIL) 10 MG tablet TK 1 T PO Q 8 H PRF MSP 07/12/18   [provider]    Current Outpatient Medications  Medication Sig Dispense Refill    escitalopram (LEXAPRO) 10 MG tablet Take 10 mg by mouth daily.     Multiple Vitamin (MULTIVITAMIN WITH MINERALS) TABS tablet Take 1 tablet by mouth daily.     OVER THE COUNTER MEDICATION 1,000 mg at bedtime. CBD lotion, uses nightly on heel.     Testosterone 20.25 MG/ACT (1.62%) GEL Place 3 Act onto the skin daily.     beta carotene w/minerals (OCUVITE) tablet Take 1 tablet by mouth daily.     Cholecalciferol (VITAMIN D PO) Take 1 tablet by mouth daily.     cyclobenzaprine (FLEXERIL) 10 MG tablet TK 1 T PO Q 8 H PRF MSP     Current Facility-Administered Medications  Medication Dose Route Frequency Provider Last Rate Last Admin   0.9 %  sodium chloride infusion  500 mL Intravenous Once Eugene Isadore, Carlota Raspberry, MD        Allergies as of 04/22/2021   (No Known Allergies)    Family History  Problem Relation Age of Onset   Lung cancer Mother    Prostate cancer Father    Melanoma Sister    Heart attack Paternal Grandfather    Prostate cancer Paternal Uncle    Colon cancer Neg Hx    Esophageal cancer Neg Hx    Pancreatic cancer Neg Hx    Stomach cancer Neg Hx    Liver disease Neg Hx     Social  History   Socioeconomic History   Marital status: Married    Spouse name: Not on file   Number of children: 3   Years of education: Not on file   Highest education level: Not on file  Occupational History   Occupation: [employer]    Employer: [employer]  Tobacco Use   Smoking status: Former    Years: 10.00    Types: Cigarettes    Quit date: 2008    Years since quitting: 14.7   Smokeless tobacco: Former    Types: Snuff  Vaping Use   Vaping Use: Never used  Substance and Sexual Activity   Alcohol use: Yes    Alcohol/week: 1.0 standard drink    Types: 1 Cans of beer per week   Drug use: No   Sexual activity: Not on file  Other Topics Concern   Not on file  Social History Narrative   Not on file   Social Determinants of Health   Financial Resource Strain:  Not on file  Food Insecurity: Not on file  Transportation Needs: Not on file  Physical Activity: Not on file  Stress: Not on file  Social Connections: Not on file  Intimate Partner Violence: Not on file    Review of Systems: All other review of systems negative except as mentioned in the HPI.  Physical Exam: Vital signs BP (!) 113/48   Pulse 62   Temp 97.7 F (36.5 C)   Ht '5\' 11"'$  (1.803 m)   Wt 182 lb (82.6 kg)   SpO2 97%   BMI 25.38 kg/m   General:   Alert,  Well-developed, well-nourished, pleasant and cooperative in NAD Lungs:  Clear throughout to auscultation.   Heart:  Regular rate and rhythm Abdomen:  Soft, nontender and nondistended.   Neuro/Psych:  Alert and cooperative. Normal mood and affect. A and O x 3  Jolly Mango, MD [facility]

## 2021-04-24 ENCOUNTER — Telehealth: Payer: Self-pay

## 2021-04-24 NOTE — Telephone Encounter (Signed)
  Follow up Call-  Call back number 04/22/2021  Post procedure Call Back phone  # [phone]  Permission to leave phone message Yes  Some recent data might be hidden     Patient questions:  Do you have a fever, pain , or abdominal swelling? No. Pain Score  0 *  Have you tolerated food without any problems? Yes.    Have you been able to return to your normal activities? Yes.    Do you have any questions about your discharge instructions: Diet   No. Medications  No. Follow up visit  No.  Do you have questions or concerns about your Care? No.  Actions: * If pain score is 4 or above: No action needed, pain <4.  Have you developed a fever since your procedure? no  2.   Have you had an respiratory symptoms (SOB or cough) since your procedure? no  3.   Have you tested positive for COVID 19 since your procedure no  4.   Have you had any family members/close contacts diagnosed with the COVID 19 since your procedure?  no   If yes to any of these questions please route to Joylene John, RN and Joella Prince, RN

## 2022-04-30 ENCOUNTER — Encounter: Payer: Self-pay | Admitting: Gastroenterology

## 2022-12-16 ENCOUNTER — Other Ambulatory Visit (HOSPITAL_COMMUNITY): Payer: Self-pay

## 2022-12-16 MED ORDER — ZEPBOUND 2.5 MG/0.5ML ~~LOC~~ SOAJ
2.5000 mg | SUBCUTANEOUS | 5 refills | Status: AC
  Filled 2022-12-16: qty 2, 28d supply, fill #0
  Filled 2023-01-06: qty 2, 28d supply, fill #1
  Filled 2023-02-03: qty 2, 28d supply, fill #2
  Filled 2023-03-04: qty 2, 28d supply, fill #3

## 2023-01-06 ENCOUNTER — Other Ambulatory Visit (HOSPITAL_COMMUNITY): Payer: Self-pay

## 2023-02-03 ENCOUNTER — Other Ambulatory Visit (HOSPITAL_COMMUNITY): Payer: Self-pay

## 2023-02-05 ENCOUNTER — Other Ambulatory Visit (HOSPITAL_COMMUNITY): Payer: Self-pay

## 2023-03-04 ENCOUNTER — Other Ambulatory Visit (HOSPITAL_COMMUNITY): Payer: Self-pay

## 2023-03-15 ENCOUNTER — Other Ambulatory Visit (HOSPITAL_COMMUNITY): Payer: Self-pay

## 2023-03-30 ENCOUNTER — Other Ambulatory Visit (HOSPITAL_COMMUNITY): Payer: Self-pay

## 2023-03-30 MED ORDER — ZEPBOUND 5 MG/0.5ML ~~LOC~~ SOAJ
5.0000 mg | SUBCUTANEOUS | 5 refills | Status: AC
  Filled 2023-03-30: qty 2, 28d supply, fill #0
  Filled 2023-04-28: qty 2, 28d supply, fill #1
  Filled 2023-06-01: qty 2, 28d supply, fill #2
  Filled 2023-06-28 – 2023-06-30 (×2): qty 2, 28d supply, fill #3

## 2023-04-28 ENCOUNTER — Other Ambulatory Visit (HOSPITAL_COMMUNITY): Payer: Self-pay

## 2023-06-01 ENCOUNTER — Other Ambulatory Visit (HOSPITAL_COMMUNITY): Payer: Self-pay

## 2023-06-28 ENCOUNTER — Other Ambulatory Visit (HOSPITAL_COMMUNITY): Payer: Self-pay

## 2023-06-29 ENCOUNTER — Other Ambulatory Visit (HOSPITAL_COMMUNITY): Payer: Self-pay

## 2023-06-29 MED ORDER — ZEPBOUND 7.5 MG/0.5ML ~~LOC~~ SOAJ
7.5000 mg | SUBCUTANEOUS | 3 refills | Status: DC
  Filled 2023-06-29 – 2023-07-05 (×2): qty 2, 28d supply, fill #0
  Filled 2023-08-16: qty 2, 28d supply, fill #1
  Filled 2023-10-05: qty 2, 28d supply, fill #2
  Filled 2023-12-03: qty 2, 28d supply, fill #3

## 2023-06-30 ENCOUNTER — Other Ambulatory Visit: Payer: Self-pay

## 2023-06-30 ENCOUNTER — Other Ambulatory Visit (HOSPITAL_COMMUNITY): Payer: Self-pay

## 2023-07-05 ENCOUNTER — Other Ambulatory Visit (HOSPITAL_COMMUNITY): Payer: Self-pay

## 2023-08-16 ENCOUNTER — Other Ambulatory Visit (HOSPITAL_COMMUNITY): Payer: Self-pay

## 2023-10-05 ENCOUNTER — Other Ambulatory Visit: Payer: Self-pay

## 2023-12-03 ENCOUNTER — Other Ambulatory Visit (HOSPITAL_COMMUNITY): Payer: Self-pay

## 2024-01-24 ENCOUNTER — Other Ambulatory Visit (HOSPITAL_COMMUNITY): Payer: Self-pay

## 2024-01-25 ENCOUNTER — Other Ambulatory Visit (HOSPITAL_COMMUNITY): Payer: Self-pay

## 2024-01-25 MED ORDER — ZEPBOUND 7.5 MG/0.5ML ~~LOC~~ SOAJ
7.5000 mg | SUBCUTANEOUS | 3 refills | Status: DC
  Filled 2024-01-25: qty 2, 28d supply, fill #0
  Filled 2024-03-06: qty 2, 28d supply, fill #1
  Filled 2024-04-26: qty 2, 28d supply, fill #2
  Filled 2024-06-25: qty 2, 28d supply, fill #3

## 2024-01-27 ENCOUNTER — Other Ambulatory Visit (HOSPITAL_COMMUNITY): Payer: Self-pay

## 2024-03-06 ENCOUNTER — Other Ambulatory Visit (HOSPITAL_COMMUNITY): Payer: Self-pay

## 2024-03-07 ENCOUNTER — Other Ambulatory Visit (HOSPITAL_COMMUNITY): Payer: Self-pay

## 2024-04-26 ENCOUNTER — Other Ambulatory Visit (HOSPITAL_COMMUNITY): Payer: Self-pay

## 2024-06-26 ENCOUNTER — Other Ambulatory Visit (HOSPITAL_COMMUNITY): Payer: Self-pay

## 2024-08-22 ENCOUNTER — Other Ambulatory Visit (HOSPITAL_COMMUNITY): Payer: Self-pay

## 2024-08-22 MED ORDER — ZEPBOUND 7.5 MG/0.5ML ~~LOC~~ SOAJ
7.5000 mg | SUBCUTANEOUS | 3 refills | Status: AC
  Filled 2024-08-22: qty 2, 28d supply, fill #0
